# Patient Record
Sex: Male | Born: 1940 | Race: White | Hispanic: No | Marital: Married | Smoking: Former smoker
Health system: Southern US, Community
[De-identification: ages and names within clinical notes are randomized; demographics above are authoritative.]

## PROBLEM LIST (undated history)

## (undated) DIAGNOSIS — R7303 Prediabetes: Secondary | ICD-10-CM

## (undated) DIAGNOSIS — C801 Malignant (primary) neoplasm, unspecified: Secondary | ICD-10-CM

## (undated) DIAGNOSIS — K219 Gastro-esophageal reflux disease without esophagitis: Secondary | ICD-10-CM

## (undated) DIAGNOSIS — Z87442 Personal history of urinary calculi: Secondary | ICD-10-CM

## (undated) DIAGNOSIS — I209 Angina pectoris, unspecified: Secondary | ICD-10-CM

## (undated) DIAGNOSIS — G459 Transient cerebral ischemic attack, unspecified: Secondary | ICD-10-CM

## (undated) DIAGNOSIS — E7800 Pure hypercholesterolemia, unspecified: Secondary | ICD-10-CM

## (undated) DIAGNOSIS — E119 Type 2 diabetes mellitus without complications: Secondary | ICD-10-CM

## (undated) DIAGNOSIS — F419 Anxiety disorder, unspecified: Secondary | ICD-10-CM

## (undated) DIAGNOSIS — I499 Cardiac arrhythmia, unspecified: Secondary | ICD-10-CM

## (undated) DIAGNOSIS — I639 Cerebral infarction, unspecified: Secondary | ICD-10-CM

## (undated) DIAGNOSIS — N289 Disorder of kidney and ureter, unspecified: Secondary | ICD-10-CM

## (undated) DIAGNOSIS — I10 Essential (primary) hypertension: Secondary | ICD-10-CM

## (undated) HISTORY — PX: NEPHRECTOMY: SHX65

## (undated) HISTORY — PX: TONSILLECTOMY: SUR1361

---

## 2018-10-04 ENCOUNTER — Other Ambulatory Visit: Payer: Self-pay

## 2018-10-04 ENCOUNTER — Observation Stay (HOSPITAL_BASED_OUTPATIENT_CLINIC_OR_DEPARTMENT_OTHER)
Admission: EM | Admit: 2018-10-04 | Discharge: 2018-10-05 | Disposition: A | Discharge status: Home / Self Care | Payer: Medicare HMO | Attending: Family Medicine | Admitting: Family Medicine

## 2018-10-04 ENCOUNTER — Emergency Department (HOSPITAL_BASED_OUTPATIENT_CLINIC_OR_DEPARTMENT_OTHER): Payer: Medicare HMO

## 2018-10-04 ENCOUNTER — Observation Stay (HOSPITAL_COMMUNITY): Payer: Medicare HMO

## 2018-10-04 ENCOUNTER — Encounter (HOSPITAL_BASED_OUTPATIENT_CLINIC_OR_DEPARTMENT_OTHER): Payer: Self-pay | Admitting: *Deleted

## 2018-10-04 DIAGNOSIS — K219 Gastro-esophageal reflux disease without esophagitis: Secondary | ICD-10-CM | POA: Diagnosis not present

## 2018-10-04 DIAGNOSIS — I129 Hypertensive chronic kidney disease with stage 1 through stage 4 chronic kidney disease, or unspecified chronic kidney disease: Secondary | ICD-10-CM | POA: Insufficient documentation

## 2018-10-04 DIAGNOSIS — I10 Essential (primary) hypertension: Secondary | ICD-10-CM | POA: Diagnosis not present

## 2018-10-04 DIAGNOSIS — E871 Hypo-osmolality and hyponatremia: Secondary | ICD-10-CM | POA: Diagnosis present

## 2018-10-04 DIAGNOSIS — Z87891 Personal history of nicotine dependence: Secondary | ICD-10-CM | POA: Insufficient documentation

## 2018-10-04 DIAGNOSIS — Z7984 Long term (current) use of oral hypoglycemic drugs: Secondary | ICD-10-CM | POA: Diagnosis not present

## 2018-10-04 DIAGNOSIS — I639 Cerebral infarction, unspecified: Principal | ICD-10-CM | POA: Insufficient documentation

## 2018-10-04 DIAGNOSIS — E1129 Type 2 diabetes mellitus with other diabetic kidney complication: Secondary | ICD-10-CM | POA: Diagnosis present

## 2018-10-04 DIAGNOSIS — E785 Hyperlipidemia, unspecified: Secondary | ICD-10-CM | POA: Diagnosis not present

## 2018-10-04 DIAGNOSIS — N183 Chronic kidney disease, stage 3 unspecified: Secondary | ICD-10-CM | POA: Diagnosis present

## 2018-10-04 DIAGNOSIS — E1122 Type 2 diabetes mellitus with diabetic chronic kidney disease: Secondary | ICD-10-CM | POA: Diagnosis not present

## 2018-10-04 DIAGNOSIS — I447 Left bundle-branch block, unspecified: Secondary | ICD-10-CM | POA: Diagnosis not present

## 2018-10-04 DIAGNOSIS — G459 Transient cerebral ischemic attack, unspecified: Secondary | ICD-10-CM | POA: Diagnosis not present

## 2018-10-04 DIAGNOSIS — Z20828 Contact with and (suspected) exposure to other viral communicable diseases: Secondary | ICD-10-CM | POA: Diagnosis not present

## 2018-10-04 DIAGNOSIS — R202 Paresthesia of skin: Secondary | ICD-10-CM | POA: Diagnosis present

## 2018-10-04 HISTORY — DX: Transient cerebral ischemic attack, unspecified: G45.9

## 2018-10-04 HISTORY — DX: Anxiety disorder, unspecified: F41.9

## 2018-10-04 HISTORY — DX: Malignant (primary) neoplasm, unspecified: C80.1

## 2018-10-04 HISTORY — DX: Essential (primary) hypertension: I10

## 2018-10-04 HISTORY — DX: Type 2 diabetes mellitus without complications: E11.9

## 2018-10-04 HISTORY — DX: Disorder of kidney and ureter, unspecified: N28.9

## 2018-10-04 HISTORY — DX: Personal history of urinary calculi: Z87.442

## 2018-10-04 HISTORY — DX: Gastro-esophageal reflux disease without esophagitis: K21.9

## 2018-10-04 HISTORY — DX: Prediabetes: R73.03

## 2018-10-04 HISTORY — DX: Cardiac arrhythmia, unspecified: I49.9

## 2018-10-04 HISTORY — DX: Pure hypercholesterolemia, unspecified: E78.00

## 2018-10-04 HISTORY — DX: Cerebral infarction, unspecified: I63.9

## 2018-10-04 HISTORY — DX: Angina pectoris, unspecified: I20.9

## 2018-10-04 LAB — COMPREHENSIVE METABOLIC PANEL
ALT: 46 U/L — ABNORMAL HIGH (ref 0–44)
AST: 27 U/L (ref 15–41)
Albumin: 4.2 g/dL (ref 3.5–5.0)
Alkaline Phosphatase: 44 U/L (ref 38–126)
Anion gap: 14 (ref 5–15)
BUN: 14 mg/dL (ref 8–23)
CO2: 22 mmol/L (ref 22–32)
Calcium: 8.9 mg/dL (ref 8.9–10.3)
Chloride: 92 mmol/L — ABNORMAL LOW (ref 98–111)
Creatinine, Ser: 1.5 mg/dL — ABNORMAL HIGH (ref 0.61–1.24)
GFR calc Af Amer: 51 mL/min — ABNORMAL LOW (ref >60)
GFR calc non Af Amer: 44 mL/min — ABNORMAL LOW (ref >60)
Glucose, Bld: 105 mg/dL — ABNORMAL HIGH (ref 70–99)
Potassium: 3.6 mmol/L (ref 3.5–5.1)
Sodium: 128 mmol/L — ABNORMAL LOW (ref 135–145)
Total Bilirubin: 1.2 mg/dL (ref 0.3–1.2)
Total Protein: 6.8 g/dL (ref 6.5–8.1)

## 2018-10-04 LAB — DIFFERENTIAL
Abs Immature Granulocytes: 0.05 10*3/uL (ref 0.00–0.07)
Basophils Absolute: 0.1 10*3/uL (ref 0.0–0.1)
Basophils Relative: 1 %
Eosinophils Absolute: 0.2 10*3/uL (ref 0.0–0.5)
Eosinophils Relative: 2 %
Immature Granulocytes: 1 %
Lymphocytes Relative: 12 %
Lymphs Abs: 1.2 10*3/uL (ref 0.7–4.0)
Monocytes Absolute: 1.3 10*3/uL — ABNORMAL HIGH (ref 0.1–1.0)
Monocytes Relative: 12 %
Neutro Abs: 7.8 10*3/uL — ABNORMAL HIGH (ref 1.7–7.7)
Neutrophils Relative %: 72 %

## 2018-10-04 LAB — CBC
HCT: 46.3 % (ref 39.0–52.0)
Hemoglobin: 15.8 g/dL (ref 13.0–17.0)
MCH: 29.2 pg (ref 26.0–34.0)
MCHC: 34.1 g/dL (ref 30.0–36.0)
MCV: 85.6 fL (ref 80.0–100.0)
Platelets: 217 10*3/uL (ref 150–400)
RBC: 5.41 MIL/uL (ref 4.22–5.81)
RDW: 12.6 % (ref 11.5–15.5)
WBC: 10.6 10*3/uL — ABNORMAL HIGH (ref 4.0–10.5)
nRBC: 0 % (ref 0.0–0.2)

## 2018-10-04 LAB — APTT: aPTT: 27 seconds (ref 24–36)

## 2018-10-04 LAB — GLUCOSE, CAPILLARY: Glucose-Capillary: 106 mg/dL — ABNORMAL HIGH (ref 70–99)

## 2018-10-04 LAB — OSMOLALITY: Osmolality: 262 mOsm/kg — ABNORMAL LOW (ref 275–295)

## 2018-10-04 LAB — TROPONIN I (HIGH SENSITIVITY)
Troponin I (High Sensitivity): 8 ng/L (ref <18)
Troponin I (High Sensitivity): 9 ng/L (ref <18)

## 2018-10-04 LAB — SARS CORONAVIRUS 2 BY RT PCR (HOSPITAL ORDER, PERFORMED IN ~~LOC~~ HOSPITAL LAB): SARS Coronavirus 2: NEGATIVE

## 2018-10-04 LAB — PROTIME-INR
INR: 1 (ref 0.8–1.2)
Prothrombin Time: 13.2 seconds (ref 11.4–15.2)

## 2018-10-04 LAB — CBG MONITORING, ED: Glucose-Capillary: 106 mg/dL — ABNORMAL HIGH (ref 70–99)

## 2018-10-04 LAB — SODIUM: Sodium: 124 mmol/L — ABNORMAL LOW (ref 135–145)

## 2018-10-04 MED ORDER — HYDRALAZINE HCL 20 MG/ML IJ SOLN: 5.0000 mg | INTRAMUSCULAR | Status: DC | PRN

## 2018-10-04 MED ORDER — ASPIRIN 81 MG PO CHEW
324.0000 mg | CHEWABLE_TABLET | Freq: Once | ORAL | Status: AC
  Administered 2018-10-04: 324 mg via ORAL
  Filled 2018-10-04: qty 4

## 2018-10-04 MED ORDER — SODIUM CHLORIDE 0.9% FLUSH
3.0000 mL | Freq: Once | INTRAVENOUS | Status: DC
  Filled 2018-10-04: qty 3

## 2018-10-04 MED ORDER — INSULIN ASPART 100 UNIT/ML ~~LOC~~ SOLN: 0.0000 [IU] | Freq: Three times a day (TID) | SUBCUTANEOUS | Status: DC

## 2018-10-04 MED ORDER — METOPROLOL TARTRATE 25 MG PO TABS
25.0000 mg | ORAL_TABLET | Freq: Two times a day (BID) | ORAL | Status: DC
  Administered 2018-10-05: 25 mg via ORAL
  Filled 2018-10-04: qty 1

## 2018-10-04 MED ORDER — ATORVASTATIN CALCIUM 10 MG PO TABS: 10.0000 mg | ORAL_TABLET | Freq: Every day | ORAL | Status: DC

## 2018-10-04 MED ORDER — ATORVASTATIN CALCIUM 80 MG PO TABS
80.0000 mg | ORAL_TABLET | Freq: Every day | ORAL | Status: DC
  Administered 2018-10-05: 80 mg via ORAL
  Filled 2018-10-04: qty 1

## 2018-10-04 MED ORDER — FINASTERIDE 5 MG PO TABS
5.0000 mg | ORAL_TABLET | Freq: Every day | ORAL | Status: DC
  Filled 2018-10-04: qty 1

## 2018-10-04 MED ORDER — GADOBUTROL 1 MMOL/ML IV SOLN
10.0000 mL | Freq: Once | INTRAVENOUS | Status: AC | PRN
  Administered 2018-10-04: 10 mL via INTRAVENOUS

## 2018-10-04 MED ORDER — SODIUM CHLORIDE 0.9 % IV BOLUS
1000.0000 mL | Freq: Once | INTRAVENOUS | Status: AC
  Administered 2018-10-04: 1000 mL via INTRAVENOUS

## 2018-10-04 MED ORDER — ONDANSETRON HCL 4 MG/2ML IJ SOLN: 4.0000 mg | Freq: Three times a day (TID) | INTRAMUSCULAR | Status: DC | PRN

## 2018-10-04 MED ORDER — PANTOPRAZOLE SODIUM 40 MG PO TBEC
40.0000 mg | DELAYED_RELEASE_TABLET | Freq: Every day | ORAL | Status: DC
  Administered 2018-10-04: 40 mg via ORAL
  Filled 2018-10-04 (×2): qty 1

## 2018-10-04 MED ORDER — ENOXAPARIN SODIUM 40 MG/0.4ML ~~LOC~~ SOLN
40.0000 mg | SUBCUTANEOUS | Status: DC
  Administered 2018-10-04: 40 mg via SUBCUTANEOUS
  Filled 2018-10-04: qty 0.4

## 2018-10-04 MED ORDER — STROKE: EARLY STAGES OF RECOVERY BOOK
Freq: Once | Status: AC
  Administered 2018-10-04: 21:00:00
  Filled 2018-10-04: qty 1

## 2018-10-04 MED ORDER — ZOLPIDEM TARTRATE 5 MG PO TABS
5.0000 mg | ORAL_TABLET | Freq: Every evening | ORAL | Status: DC | PRN
  Administered 2018-10-04: 5 mg via ORAL
  Filled 2018-10-04: qty 1

## 2018-10-04 MED ORDER — TAMSULOSIN HCL 0.4 MG PO CAPS: 0.4000 mg | ORAL_CAPSULE | Freq: Every day | ORAL | Status: DC

## 2018-10-04 MED ORDER — ACETAMINOPHEN 325 MG PO TABS: 650.0000 mg | ORAL_TABLET | Freq: Four times a day (QID) | ORAL | Status: DC | PRN

## 2018-10-04 MED ORDER — SENNOSIDES-DOCUSATE SODIUM 8.6-50 MG PO TABS: 1.0000 | ORAL_TABLET | Freq: Every evening | ORAL | Status: DC | PRN

## 2018-10-04 MED ORDER — INSULIN ASPART 100 UNIT/ML ~~LOC~~ SOLN: 0.0000 [IU] | Freq: Every day | SUBCUTANEOUS | Status: DC

## 2018-10-04 MED ORDER — ASPIRIN EC 325 MG PO TBEC: 325.0000 mg | DELAYED_RELEASE_TABLET | Freq: Every day | ORAL | Status: DC

## 2018-10-04 NOTE — ED Notes (Signed)
Patient transported to CT 

## 2018-10-04 NOTE — Consult Note (Signed)
   [facility] TeleNeurology Consult Services   Date of Service:   10/04/2018 12:56:10  Impression:     .  Left Hemispheric Infarct  Comments/Sign-Out: Patient presented with right arm numbness. HCT showed no acute process. No tPA was offered due to non disabling symptoms. Would recommend ASA for secondary prevention.  Metrics: Last Known Well: 10/04/2018 09:30:00 [facility] Notification Time: 10/04/2018 12:55:15 Arrival Time: 10/04/2018 12:31:00 Stamp Time: 10/04/2018 12:56:10 Time First Login Attempt: 10/04/2018 13:02:59 Video Start Time: 10/04/2018 13:02:59  Symptoms: Right arm numbness NIHSS Start Assessment Time: 10/04/2018 13:03:00 Patient is not a candidate for tPA. Patient was not deemed candidate for tPA thrombolytics because of Resolved symptoms (no residual disabling symptoms). Video End Time: 10/04/2018 13:16:03  CT head showed no acute hemorrhage or acute core infarct.  Clinical Presentation is not Suggestive of Large Vessel Occlusive Disease  ED Physician notified of diagnostic impression and management plan on 10/04/2018 13:12:46  Sign Out:     .  Discussed with Emergency Department Provider    ------------------------------------------------------------------------------  History of Present Illness: Patient is a 78 year old Male.  Patient was brought by private transportation with symptoms of Right arm numbness  PMH of HTN, BPH, HLD presented with right arm numbness. LKN was around [place] witnessed by wife. He initially had difficulty walking but later was able to do so. However the numbness on the right arm persisted. Although at this time it is 70% back to normal.  Last seen normal was within 4.5 hours. There is no history of hemorrhagic complications or intracranial hemorrhage. There is no history of Recent Anticoagulants. There is no history of recent major surgery. There is no history of recent stroke.  Examination: 1A: Level of  Consciousness - Alert; keenly responsive + 0 1B: Ask Month and Age - Both Questions Right + 0 1C: Blink Eyes & Squeeze Hands - Performs Both Tasks + 0 2: Test Horizontal Extraocular Movements - Normal + 0 3: Test Visual Fields - No Visual Loss + 0 4: Test Facial Palsy (Use Grimace if Obtunded) - Normal symmetry + 0 5A: Test Left Arm Motor Drift - No Drift for 10 Seconds + 0 5B: Test Right Arm Motor Drift - No Drift for 10 Seconds + 0 6A: Test Left Leg Motor Drift - No Drift for 5 Seconds + 0 6B: Test Right Leg Motor Drift - No Drift for 5 Seconds + 0 7: Test Limb Ataxia (FNF/Heel-Shin) - No Ataxia + 0 8: Test Sensation - Mild-Moderate Loss: Less Sharp/More Dull + 1 9: Test Language/Aphasia - Normal; No aphasia + 0 10: Test Dysarthria - Normal + 0 11: Test Extinction/Inattention - No abnormality + 0  NIHSS Score: 1  Patient/Family was informed the Neurology Consult would happen via TeleHealth consult by way of interactive audio and video telecommunications and consented to receiving care in this manner.    Dr Hinda Lenis Chevelle Durr   [facility] [phone]  Case [id number]

## 2018-10-04 NOTE — ED Notes (Signed)
Pt returned from CT °

## 2018-10-04 NOTE — Progress Notes (Signed)
Pt arrived to unit. AOx4

## 2018-10-04 NOTE — ED Notes (Signed)
ED TO INPATIENT HANDOFF REPORT  ED Nurse Name and Phone #: Marisa Hua RN [phone]  S Name/Age/Gender Corey Lang 78 y.o. male Room/Bed: MH10/MH10  Code Status   Code Status: Not on file  Home/SNF/Other Hospital Patient oriented to: self, place, time and situation Is this baseline? Yes   Triage Complete: Triage complete  Chief Complaint RIGHT ARM NUMBNESS AND WEAK  Triage Note Right arm numbness while on his am walk 3 hours ago. He got weak and had to call his wife to drive and pick him up. He is alert and oriented. Numbness in his right knee and hip. He has a headache. He is alert and oriented.    Allergies No Known Allergies  Level of Care/Admitting Diagnosis ED Disposition    ED Disposition Condition Comment   Admit  Hospital Area: [facility] [[id number]]  Level of Care: Telemetry Medical [104]  I expect the patient will be discharged within 24 hours: No (not a candidate for 5C-Observation unit)  Covid Evaluation: Confirmed COVID Negative  Diagnosis: TIA (transient ischemic attack) [419379]  Admitting Physician: Guilford Shi [[id number]]  Attending Physician: Guilford Shi [[id number]]  PT Class (Do Not Modify): Observation [104]  PT Acc Code (Do Not Modify): Observation [10022]       B Medical/Surgery History Past Medical History:  Diagnosis Date  . Anxiety   . Cancer ([place])   . Diabetes mellitus without complication ([place])   . High cholesterol   . High cholesterol   . Hypertension   . Renal disorder   . TIA (transient ischemic attack)    Past Surgical History:  Procedure Laterality Date  . NEPHRECTOMY    . TONSILLECTOMY       A IV Location/Drains/Wounds Patient Lines/Drains/Airways Status   Active Line/Drains/Airways    Name:   Placement date:   Placement time:   Site:   Days:   Peripheral IV 10/04/18 Right Hand   10/04/18    1303    Hand   less than 1          Intake/Output Last 24 hours  Intake/Output Summary (Last  24 hours) at 10/04/2018 1558 Last data filed at 10/04/2018 1551 Gross per 24 hour  Intake 990.52 ml  Output -  Net 990.52 ml    Labs/Imaging Results for orders placed or performed during the hospital encounter of 10/04/18 (from the past 48 hour(s))  CBG monitoring, ED     Status: Abnormal   Collection Time: 10/04/18 12:52 PM  Result Value Ref Range   Glucose-Capillary 106 (H) 70 - 99 mg/dL  CBC     Status: Abnormal   Collection Time: 10/04/18  1:00 PM  Result Value Ref Range   WBC 10.6 (H) 4.0 - 10.5 K/uL   RBC 5.41 4.22 - 5.81 MIL/uL   Hemoglobin 15.8 13.0 - 17.0 g/dL   HCT 46.3 39.0 - 52.0 %   MCV 85.6 80.0 - 100.0 fL   MCH 29.2 26.0 - 34.0 pg   MCHC 34.1 30.0 - 36.0 g/dL   RDW 12.6 11.5 - 15.5 %   Platelets 217 150 - 400 K/uL   nRBC 0.0 0.0 - 0.2 %    Comment: Performed at [facility], [address] [place], [place] [address]  Differential     Status: Abnormal   Collection Time: 10/04/18  1:00 PM  Result Value Ref Range   Neutrophils Relative % 72 %   Neutro Abs 7.8 (H) 1.7 - 7.7 K/uL  Lymphocytes Relative 12 %   Lymphs Abs 1.2 0.7 - 4.0 K/uL   Monocytes Relative 12 %   Monocytes Absolute 1.3 (H) 0.1 - 1.0 K/uL   Eosinophils Relative 2 %   Eosinophils Absolute 0.2 0.0 - 0.5 K/uL   Basophils Relative 1 %   Basophils Absolute 0.1 0.0 - 0.1 K/uL   Immature Granulocytes 1 %   Abs Immature Granulocytes 0.05 0.00 - 0.07 K/uL    Comment: Performed at [facility], [address] [place], [place] [address]  Comprehensive metabolic panel     Status: Abnormal   Collection Time: 10/04/18  1:00 PM  Result Value Ref Range   Sodium 128 (L) 135 - 145 mmol/L   Potassium 3.6 3.5 - 5.1 mmol/L   Chloride 92 (L) 98 - 111 mmol/L   CO2 22 22 - 32 mmol/L   Glucose, Bld 105 (H) 70 - 99 mg/dL   BUN 14 8 - 23 mg/dL   Creatinine, Ser 1.50 (H) 0.61 - 1.24 mg/dL   Calcium 8.9 8.9 - 10.3 mg/dL   Total Protein 6.8 6.5 - 8.1 g/dL   Albumin 4.2 3.5 - 5.0 g/dL   AST  27 15 - 41 U/L   ALT 46 (H) 0 - 44 U/L   Alkaline Phosphatase 44 38 - 126 U/L   Total Bilirubin 1.2 0.3 - 1.2 mg/dL   GFR calc non Af Amer 44 (L) >60 mL/min   GFR calc Af Amer 51 (L) >60 mL/min   Anion gap 14 5 - 15    Comment: Performed at [facility], [address] [place], [place] [address]  SARS Coronavirus 2 (Performed in [place] hospital lab)     Status: None   Collection Time: 10/04/18  1:01 PM   Specimen: Nasopharyngeal Swab  Result Value Ref Range   SARS Coronavirus 2 NEGATIVE NEGATIVE    Comment: (NOTE) If result is NEGATIVE SARS-CoV-2 target nucleic acids are NOT DETECTED. The SARS-CoV-2 RNA is generally detectable in upper and lower  respiratory specimens during the acute phase of infection. The lowest  concentration of SARS-CoV-2 viral copies this assay can detect is 250  copies / mL. A negative result does not preclude SARS-CoV-2 infection  and should not be used as the sole basis for treatment or other  patient management decisions.  A negative result may occur with  improper specimen collection / handling, submission of specimen other  than nasopharyngeal swab, presence of viral mutation(s) within the  areas targeted by this assay, and inadequate number of viral copies  (<250 copies / mL). A negative result must be combined with clinical  observations, patient history, and epidemiological information. If result is POSITIVE SARS-CoV-2 target nucleic acids are DETECTED. The SARS-CoV-2 RNA is generally detectable in upper and lower  respiratory specimens dur ing the acute phase of infection.  Positive  results are indicative of active infection with SARS-CoV-2.  Clinical  correlation with patient history and other diagnostic information is  necessary to determine patient infection status.  Positive results do  not rule out bacterial infection or co-infection with other viruses. If result is PRESUMPTIVE POSTIVE SARS-CoV-2 nucleic acids MAY BE  PRESENT.   A presumptive positive result was obtained on the submitted specimen  and confirmed on repeat testing.  While 2019 novel coronavirus  (SARS-CoV-2) nucleic acids may be present in the submitted sample  additional confirmatory testing may be necessary for epidemiological  and / or clinical  management purposes  to differentiate between  SARS-CoV-2 and other Sarbecovirus currently known to infect humans.  If clinically indicated additional testing with an alternate test  methodology [phone]) is advised. The SARS-CoV-2 RNA is generally  detectable in upper and lower respiratory sp ecimens during the acute  phase of infection. The expected result is Negative. Fact Sheet for Patients:  StrictlyIdeas.no Fact Sheet for Healthcare Providers: BankingDealers.co.za This test is not yet approved or cleared by the [place] FDA and has been authorized for detection and/or diagnosis of SARS-CoV-2 by FDA under an Emergency Use Authorization (EUA).  This EUA will remain in effect (meaning this test can be used) for the duration of the COVID-19 declaration under Section 564(b)(1) of the Act, 21 U.S.C. section 360bbb-3(b)(1), unless the authorization is terminated or revoked sooner. Performed at [facility], [address] [place], [place] [address]   Protime-INR     Status: None   Collection Time: 10/04/18  2:37 PM  Result Value Ref Range   Prothrombin Time 13.2 11.4 - 15.2 seconds   INR 1.0 0.8 - 1.2    Comment: (NOTE) INR goal varies based on device and disease states. Performed at [facility], [address] [place], [place] [address]   APTT     Status: None   Collection Time: 10/04/18  2:37 PM  Result Value Ref Range   aPTT 27 24 - 36 seconds    Comment: Performed at [facility], [address] [place], [place] [address]   Ct Head Code Stroke Wo Contrast  Result Date: 10/04/2018 CLINICAL  DATA:  Code stroke. Numbness and tingling of the right arm with right-sided weakness beginning today. EXAM: CT HEAD WITHOUT CONTRAST TECHNIQUE: Contiguous axial images were obtained from the base of the skull through the vertex without intravenous contrast. COMPARISON:  None. FINDINGS: Brain: Generalized age related atrophy. No focal insult seen affecting the brainstem or cerebellum. Cerebral hemispheres show chronic small-vessel ischemic changes of the white matter. Focal 4 mm parenchymal calcification at the inferior right frontal lobe. No sign of mass effect or edema. No hemorrhage, hydrocephalus or extra-axial collection. Vascular: There is atherosclerotic calcification of the major vessels at the base of the brain. Skull: Negative Sinuses/Orbits: Clear/normal Other: None ASPECTS (Pace Stroke Program Early CT Score) - Ganglionic level infarction (caudate, lentiform nuclei, internal capsule, insula, M1-M3 cortex): 7 - Supraganglionic infarction (M4-M6 cortex): 3 Total score (0-10 with 10 being normal): 10 IMPRESSION: 1. No acute finding by CT. Age related atrophy and chronic small-vessel change of the hemispheric white matter. Benign appearing 4 mm parenchymal calcification at the inferior right frontal lobe. 2. ASPECTS is 10. 3. These results were called by telephone at the time of interpretation on 10/04/2018 at 1:05 pm to Dr. Gerlene Fee , who verbally acknowledged these results. Electronically Signed   By: Nelson Chimes M.D.   On: 10/04/2018 13:08    Pending Labs Unresulted Labs (From admission, onward)   None      Vitals/Pain Today's Vitals   10/04/18 1404 10/04/18 1430 10/04/18 1530 10/04/18 1535  BP: (!) 155/77 140/85 140/81   Pulse: 63 (!) 59 (!) 56   Resp: 20 (!) 23 19   Temp:      TempSrc:      SpO2: 93% 96% 100%   Weight:      Height:      PainSc:    0-No pain    Isolation Precautions No active  isolations  Medications Medications  sodium chloride flush (NS) 0.9 % injection  3 mL (3 mLs Intravenous Not Given 10/04/18 1258)  sodium chloride 0.9 % bolus 1,000 mL ( Intravenous Stopped 10/04/18 1549)  aspirin chewable tablet 324 mg (324 mg Oral Given 10/04/18 1448)    Mobility walks Low fall risk   Focused Assessments Neuro Assessment Handoff:  Swallow screen pass? Yes    NIH Stroke Scale ( + Modified Stroke Scale Criteria)  Level of Consciousness (1a.)   : Alert, keenly responsive LOC Questions (1b. )   +: Answers both questions correctly LOC Commands (1c. )   + : Performs both tasks correctly Best Gaze (2. )  +: Normal Visual (3. )  +: No visual loss Facial Palsy (4. )    : Normal symmetrical movements Motor Arm, Left (5a. )   +: No drift Motor Arm, Right (5b. )   +: No drift Motor Leg, Left (6a. )   +: No drift Motor Leg, Right (6b. )   +: No drift Limb Ataxia (7. ): Absent Sensory (8. )   +: Mild-to-moderate sensory loss, patient feels pinprick is less sharp or is dull on the affected side, or there is a loss of superficial pain with pinprick, but patient is aware of being touched Best Language (9. )   +: No aphasia Dysarthria (10. ): Normal Extinction/Inattention (11.)   +: No Abnormality Modified SS Total  +: 1 Complete NIHSS TOTAL: 1     Neuro Assessment:   Neuro Checks:      Last Documented NIHSS Modified Score: 1 (10/04/18 1449) Has TPA been given? No If patient is a Neuro Trauma and patient is going to OR before floor call report to [place] nurse: [phone] or [phone]     R Recommendations: See Admitting Provider Note  Report given to:   Additional Notes: Pt has poor kidney function. No contrast done with CTA

## 2018-10-04 NOTE — ED Triage Notes (Signed)
Right arm numbness while on his am walk 3 hours ago. He got weak and had to call his wife to drive and pick him up. He is alert and oriented. Numbness in his right knee and hip. He has a headache. He is alert and oriented.

## 2018-10-04 NOTE — H&P (Addendum)
History and Physical    Corey Lang GYB:[id number] DOB: [birth year] DOA: 10/04/2018  Referring MD/NP/PA:   PCP: Reita Cliche, MD   Patient coming from:  The patient is coming from home.  At baseline, pt is independent for most of ADL.        Chief Complaint: Right-sided numbness and weakness  HPI: Corey Lang is a 78 y.o. 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  Patient states that his symptoms started at about 9:30 AM when he was walking. He states that he has numbness and weakness in both right arm and upper leg.  He also had possible slurred speech.  Patient states that his symptoms improved, but still has some numbness and weakness in both right arm and the leg.  He also has headache.  No vision loss or hearing loss.  Patient denies any chest pain, shortness breath, cough, fever or chills.  No nausea vomiting, diarrhea, abdominal pain, symptoms of UTI.  ED Course: pt was found to have WBC 10.6, INR 1.0, PTT 27, negative COVID-19 test, sodium 128, creatinine close to baseline, temperature normal, blood pressure 170/98, heart rate 58, RR 20s, oxygen saturation 94% on room air.  CT head is negative for acute intracranial abnormalities.  Telemetry neurology, Dr. Drema Halon was consulted by EDP, who did not think patient need TPA.  Review of Systems:   General: no fevers, chills, no body weight gain, has fatigue HEENT: no blurry vision, hearing changes or sore throat Respiratory: no dyspnea, coughing, wheezing CV: no chest pain, no palpitations GI: no nausea, vomiting, abdominal pain, diarrhea, constipation GU: no dysuria, burning on urination, increased urinary frequency, hematuria  Ext: no leg edema Neuro: No vision change or hearing loss. Has weakness and numbness in breath arm and leg.  Had  possible slurred speech. Skin: no rash, no skin tear. MSK: No muscle spasm, no deformity, no limitation of range of movement in spin Heme: No easy bruising.  Travel history: No recent long distant travel.  Allergy: No Known Allergies  Past Medical History:  Diagnosis Date  . Anxiety   . Cancer ([place])   . Diabetes mellitus without complication ([place])   . High cholesterol   . High cholesterol   . Hypertension   . Renal disorder   . TIA (transient ischemic attack)     Past Surgical History:  Procedure Laterality Date  . NEPHRECTOMY    . TONSILLECTOMY      Social History:  reports that he has quit smoking. He has never used smokeless tobacco. He reports that he does not drink alcohol or use drugs.  Family History: No family history on file.   Prior to Admission medications   Medication Sig Start Date End Date Taking? Authorizing Provider  atorvastatin (LIPITOR) 10 MG tablet Take 10 mg by mouth daily.   Yes [provider]  finasteride (PROSCAR) 5 MG tablet Take 5 mg by mouth daily.   Yes [provider]  hydrochlorothiazide (HYDRODIURIL) 25 MG tablet Take 25 mg by mouth daily.   Yes [provider]  metFORMIN (GLUCOPHAGE) 500 MG tablet Take by mouth 2 (two) times daily with a meal.   Yes [provider]  metoprolol tartrate (LOPRESSOR) 25 MG tablet Take 25 mg by mouth 2 (two) times daily.   Yes [provider]  montelukast (SINGULAIR) 10 MG tablet Take 10 mg  by mouth at bedtime.   Yes [provider]  RABEprazole (ACIPHEX) 20 MG tablet Take 20 mg by mouth daily.   Yes [provider]  tamsulosin (FLOMAX) 0.4 MG CAPS capsule Take 0.4 mg by mouth.   Yes [provider]  valsartan (DIOVAN) 320 MG tablet Take 320 mg by mouth daily.   Yes [provider]    Physical Exam: Vitals:   10/04/18 1630 10/04/18 1730 10/04/18 1851 10/04/18 1944  BP: (!) 132/94 (!) 146/77 (!) 170/98 (!) 179/91  Pulse: 61 (!)  57 (!) 58 62  Resp: (!) 21 20    Temp:   97.6 F (36.4 C) 97.7 F (36.5 C)  TempSrc:   Oral Oral  SpO2: 99% 98% 94% 96%  Weight:      Height:       General: Not in acute distress HEENT:       Eyes: PERRL, EOMI, no scleral icterus.       ENT: No discharge from the ears and nose, no pharynx injection, no tonsillar enlargement.        Neck: No JVD, no bruit, no mass felt. Heme: No neck lymph node enlargement. Cardiac: S1/S2, RRR, No murmurs, No gallops or rubs. Respiratory: No rales, wheezing, rhonchi or rubs. GI: Soft, nondistended, nontender, no rebound pain, no organomegaly, BS present. GU: No hematuria Ext: No pitting leg edema bilaterally. 2+DP/PT pulse bilaterally. Musculoskeletal: No joint deformities, No joint redness or warmth, no limitation of ROM in spin. Skin: No rashes.  Neuro: Alert, oriented X3, cranial nerves II-XII grossly intact, moves all extremities normally. Muscle strength 5/5 in all extremities, has decreased sensation to light touch in right arm. Brachial reflex 2+ bilaterally. Negative Babinski's sign. Psych: Patient is not psychotic, no suicidal or hemocidal ideation.  Labs on Admission: I have personally reviewed following labs and imaging studies  CBC: Recent Labs  Lab 10/04/18 1300  WBC 10.6*  NEUTROABS 7.8*  HGB 15.8  HCT 46.3  MCV 85.6  PLT 161   Basic Metabolic Panel: Recent Labs  Lab 10/04/18 1300  NA 128*  K 3.6  CL 92*  CO2 22  GLUCOSE 105*  BUN 14  CREATININE 1.50*  CALCIUM 8.9   GFR: Estimated Creatinine Clearance: 49.2 mL/min (A) (by C-G formula based on SCr of 1.5 mg/dL (H)). Liver Function Tests: Recent Labs  Lab 10/04/18 1300  AST 27  ALT 46*  ALKPHOS 44  BILITOT 1.2  PROT 6.8  ALBUMIN 4.2   No results for input(s): LIPASE, AMYLASE in the last 168 hours. No results for input(s): AMMONIA in the last 168 hours. Coagulation Profile: Recent Labs  Lab 10/04/18 1437  INR 1.0   Cardiac Enzymes: No results for  input(s): CKTOTAL, CKMB, CKMBINDEX, TROPONINI in the last 168 hours. BNP (last 3 results) No results for input(s): PROBNP in the last 8760 hours. HbA1C: No results for input(s): HGBA1C in the last 72 hours. CBG: Recent Labs  Lab 10/04/18 1252  GLUCAP 106*   Lipid Profile: No results for input(s): CHOL, HDL, LDLCALC, TRIG, CHOLHDL, LDLDIRECT in the last 72 hours. Thyroid Function Tests: No results for input(s): TSH, T4TOTAL, FREET4, T3FREE, THYROIDAB in the last 72 hours. Anemia Panel: No results for input(s): VITAMINB12, FOLATE, FERRITIN, TIBC, IRON, RETICCTPCT in the last 72 hours. Urine analysis: No results found for: COLORURINE, APPEARANCEUR, LABSPEC, PHURINE, GLUCOSEU, HGBUR, BILIRUBINUR, KETONESUR, PROTEINUR, UROBILINOGEN, NITRITE, LEUKOCYTESUR Sepsis Labs: @LABRCNTIP (procalcitonin:4,lacticidven:4) ) Recent Results (from the past 240 hour(s))  SARS Coronavirus 2 (Performed  in [place] lab)     Status: None   Collection Time: 10/04/18  1:01 PM   Specimen: Nasopharyngeal Swab  Result Value Ref Range Status   SARS Coronavirus 2 NEGATIVE NEGATIVE Final    Comment: (NOTE) If result is NEGATIVE SARS-CoV-2 target nucleic acids are NOT DETECTED. The SARS-CoV-2 RNA is generally detectable in upper and lower  respiratory specimens during the acute phase of infection. The lowest  concentration of SARS-CoV-2 viral copies this assay can detect is 250  copies / mL. A negative result does not preclude SARS-CoV-2 infection  and should not be used as the sole basis for treatment or other  patient management decisions.  A negative result may occur with  improper specimen collection / handling, submission of specimen other  than nasopharyngeal swab, presence of viral mutation(s) within the  areas targeted by this assay, and inadequate number of viral copies  (<250 copies / mL). A negative result must be combined with clinical  observations, patient history, and epidemiological  information. If result is POSITIVE SARS-CoV-2 target nucleic acids are DETECTED. The SARS-CoV-2 RNA is generally detectable in upper and lower  respiratory specimens dur ing the acute phase of infection.  Positive  results are indicative of active infection with SARS-CoV-2.  Clinical  correlation with patient history and other diagnostic information is  necessary to determine patient infection status.  Positive results do  not rule out bacterial infection or co-infection with other viruses. If result is PRESUMPTIVE POSTIVE SARS-CoV-2 nucleic acids MAY BE PRESENT.   A presumptive positive result was obtained on the submitted specimen  and confirmed on repeat testing.  While 2019 novel coronavirus  (SARS-CoV-2) nucleic acids may be present in the submitted sample  additional confirmatory testing may be necessary for epidemiological  and / or clinical management purposes  to differentiate between  SARS-CoV-2 and other Sarbecovirus currently known to infect humans.  If clinically indicated additional testing with an alternate test  methodology [phone]) is advised. The SARS-CoV-2 RNA is generally  detectable in upper and lower respiratory sp ecimens during the acute  phase of infection. The expected result is Negative. Fact Sheet for Patients:  StrictlyIdeas.no Fact Sheet for Healthcare Providers: BankingDealers.co.za This test is not yet approved or cleared by the [place] FDA and has been authorized for detection and/or diagnosis of SARS-CoV-2 by FDA under an Emergency Use Authorization (EUA).  This EUA will remain in effect (meaning this test can be used) for the duration of the COVID-19 declaration under Section 564(b)(1) of the Act, 21 U.S.C. section 360bbb-3(b)(1), unless the authorization is terminated or revoked sooner. Performed at [facility], [address] [place], [place] [address]      Radiological Exams  on Admission: Ct Head Code Stroke Wo Contrast  Result Date: 10/04/2018 CLINICAL DATA:  Code stroke. Numbness and tingling of the right arm with right-sided weakness beginning today. EXAM: CT HEAD WITHOUT CONTRAST TECHNIQUE: Contiguous axial images were obtained from the base of the skull through the vertex without intravenous contrast. COMPARISON:  None. FINDINGS: Brain: Generalized age related atrophy. No focal insult seen affecting the brainstem or cerebellum. Cerebral hemispheres show chronic small-vessel ischemic changes of the white matter. Focal 4 mm parenchymal calcification at the inferior right frontal lobe. No sign of mass effect or edema. No hemorrhage, hydrocephalus or extra-axial collection. Vascular: There is atherosclerotic calcification of the major vessels at the base of the brain. Skull: Negative Sinuses/Orbits: Clear/normal Other: None ASPECTS ([place] Stroke  Program Early CT Score) - Ganglionic level infarction (caudate, lentiform nuclei, internal capsule, insula, M1-M3 cortex): 7 - Supraganglionic infarction (M4-M6 cortex): 3 Total score (0-10 with 10 being normal): 10 IMPRESSION: 1. No acute finding by CT. Age related atrophy and chronic small-vessel change of the hemispheric white matter. Benign appearing 4 mm parenchymal calcification at the inferior right frontal lobe. 2. ASPECTS is 10. 3. These results were called by telephone at the time of interpretation on 10/04/2018 at 1:05 pm to Dr. Gerlene Fee , who verbally acknowledged these results. Electronically Signed   By: Nelson Chimes M.D.   On: 10/04/2018 13:08     EKG: Independently reviewed.  Sinus rhythm, QTC 449, left bundle blockage, poor R wave progression, LAE.  No old EKG to compare.  Assessment/Plan Principal Problem:   TIA (transient ischemic attack) Active Problems:   Type II diabetes mellitus with renal manifestations (HCC)   Hypertension   HLD (hyperlipidemia)   CKD (chronic kidney disease), stage III (HCC)    Hyponatremia   LBBB (left bundle branch block)   GERD (gastroesophageal reflux disease)   Possible TIA vs. Stroke: Patient has been having right-sided weakness and a possible slurred speech.  Symptoms has improved, but still has some numbness and weakness in the right arm and leg, symptoms is concerning for possible TIA versus stroke.  CT head is negative for acute intracranial abnormalities.  Telemetry neurology, Dr. Samara Deist was consulted by EDP, who did not think patient need TPA.  Will do stroke/TIA work-up now. Dr. Leonel Ramsay for neurology was consulted.  - will place on tele bed for obs - will follow up Neurology's Recs.  - Obtain MRI/MRA  - Check carotid dopplers  - ASA - will increase Lipitor dose from 10 to 80 mg daily - fasting lipid panel and HbA1c  - 2D transthoracic echocardiography  - swallowing screen. If fails, will get SLP - Check UDS  - PT/OT consult  Type II diabetes mellitus with renal manifestations ([place]): Last A1c not on record. Patient is taking metformin at home.  Blood sugar 105. -SSI  Hypertension: -hold HCTZ and Diovan due to hyponatremia -Continue metoprolol -IV hydralazine as needed for SBP>185  HLD (hyperlipidemia): -lipitor  CKD (chronic kidney disease), stage III ([place]): Close to baseline.  Patient had a creatinine 1.41 on 03/11/2011.  His creatinine is 1.5, BUN 14. -Patient received 1 L normal saline bolus in the ED - Follow-up by BMP  Hyponatremia: Sodium 128.  Mental status normal.  Possibly due to HCTZ and diovan use -hold HCTZ and Diovan -- Will check urine sodium, urine osmolality, serum osmolality. - check TSH - IVF: 1L NS in ED - will check Sodium level now, is still low  Addendum:  Repeated Na 124. -will start fluid restriction -Start normal saline at 100 cc/h -BMP every 6 hours   LBBB (left bundle branch block): No old EKG for comparison.  Patient does not have any chest pain or shortness breath.  Very low suspicions for heart  attack. - Trend Trop - Repeat EKG in the am  - aspirin, lipitor  - Risk factor stratification: will check FLP and A1C  - check UDS  GERD (gastroesophageal reflux disease): -protonix     DVT ppx: SQ Lovenox Code Status: Full code Family Communication: None at bed side.   Disposition Plan:  Anticipate discharge back to previous home environment Consults called: Neurology, Dr. Leonel Ramsay Admission status: Obs / tele      Date of Service 10/04/2018  Ivor Costa [facility]   If 7PM-7AM, please contact night-coverage [email or web address] Password [facility] 10/04/2018, 7:53 PM

## 2018-10-04 NOTE — ED Notes (Signed)
Pt states that he has only one kidney and typically does not get IV dye.  Spoke to Dr. Sedonia Small and related information.  He related to cancel CT with contrast due to kidney function.  Called CT and notified [place].

## 2018-10-04 NOTE — ED Notes (Signed)
ED Provider at bedside. 

## 2018-10-04 NOTE — ED Provider Notes (Signed)
[facility] Emergency Department Provider Note MRN:  [id number]  Arrival date & time: 10/04/18     Chief Complaint   Numbness   History of Present Illness   Corey Lang is a 78 y.o. year-old male with a history of diabetes, hypertension presenting to the ED with chief complaint of numbness.  At 9:30 AM this morning, patient was walking when he experienced sudden onset complete weakness of his right side, inability to walk.  The weakness slowly subsided and regained normal function.  He continued to have numbness, very mild in the right arm, more prominent in the right leg.  Denies headache, no vomiting, no other symptoms.  Review of Systems  A complete 10 system review of systems was obtained and all systems are negative except as noted in the HPI and PMH.   Patient's Health History    Past Medical History:  Diagnosis Date  . Diabetes mellitus without complication ([place])   . High cholesterol   . Hypertension     Past Surgical History:  Procedure Laterality Date  . TONSILLECTOMY      No family history on file.  Social History   Socioeconomic History  . Marital status: Married    Spouse name: Not on file  . Number of children: Not on file  . Years of education: Not on file  . Highest education level: Not on file  Occupational History  . Not on file  Social Needs  . Financial resource strain: Not on file  . Food insecurity    Worry: Not on file    Inability: Not on file  . Transportation needs    Medical: Not on file    Non-medical: Not on file  Tobacco Use  . Smoking status: Former Research scientist (life sciences)  . Smokeless tobacco: Never Used  Substance and Sexual Activity  . Alcohol use: Never    Frequency: Never  . Drug use: Never  . Sexual activity: Not on file  Lifestyle  . Physical activity    Days per week: Not on file    Minutes per session: Not on file  . Stress: Not on file  Relationships  . Social Herbalist on phone: Not on  file    Gets together: Not on file    Attends religious service: Not on file    Active member of club or organization: Not on file    Attends meetings of clubs or organizations: Not on file    Relationship status: Not on file  . Intimate partner violence    Fear of current or ex partner: Not on file    Emotionally abused: Not on file    Physically abused: Not on file    Forced sexual activity: Not on file  Other Topics Concern  . Not on file  Social History Narrative  . Not on file     Physical Exam  Vital Signs and Nursing Notes reviewed Vitals:   10/04/18 1331 10/04/18 1404  BP: (!) 143/79 (!) 155/77  Pulse: 63 63  Resp: (!) 22 20  Temp:    SpO2: 94% 93%    CONSTITUTIONAL: Well-appearing, NAD NEURO:  Alert and oriented x 3, decreased sensation to the right leg, otherwise normal sensation, normal strength, no facial droop, no aphasia, no dysarthria, no neglect, no visual field cuts EYES:  eyes equal and reactive ENT/NECK:  no LAD, no JVD CARDIO: Regular rate, well-perfused, normal S1 and S2 PULM:  CTAB no wheezing or rhonchi  GI/GU:  normal bowel sounds, non-distended, non-tender MSK/SPINE:  No gross deformities, no edema SKIN:  no rash, atraumatic PSYCH:  Appropriate speech and behavior  Diagnostic and Interventional Summary    EKG Interpretation  Date/Time:  Thursday October 04 2018 12:58:57 EDT Ventricular Rate:  67 PR Interval:    QRS Duration: 141 QT Interval:  425 QTC Calculation: 449 R Axis:   56 Text Interpretation:  Sinus rhythm Left bundle branch block Confirmed by Gerlene Fee [phone]) on 10/04/2018 2:40:43 PM      Labs Reviewed  CBC - Abnormal; Notable for the following components:      Result Value   WBC 10.6 (*)    All other components within normal limits  DIFFERENTIAL - Abnormal; Notable for the following components:   Neutro Abs 7.8 (*)    Monocytes Absolute 1.3 (*)    All other components within normal limits  COMPREHENSIVE METABOLIC PANEL -  Abnormal; Notable for the following components:   Sodium 128 (*)    Chloride 92 (*)    Glucose, Bld 105 (*)    Creatinine, Ser 1.50 (*)    ALT 46 (*)    GFR calc non Af Amer 44 (*)    GFR calc Af Amer 51 (*)    All other components within normal limits  CBG MONITORING, ED - Abnormal; Notable for the following components:   Glucose-Capillary 106 (*)    All other components within normal limits  SARS CORONAVIRUS 2 (HOSPITAL ORDER, [place] [facility])  PROTIME-INR  APTT    CT HEAD CODE STROKE WO CONTRAST  Final Result      Medications  sodium chloride flush (NS) 0.9 % injection 3 mL (3 mLs Intravenous Not Given 10/04/18 1258)  sodium chloride 0.9 % bolus 1,000 mL (has no administration in time range)  aspirin chewable tablet 324 mg (has no administration in time range)     Procedures Critical Care Critical Care Documentation Critical care time provided by me (excluding procedures): 35 minutes  Condition necessitating critical care: Concern for acute ischemic stroke  Components of critical care management: reviewing of prior records, laboratory and imaging interpretation, frequent re-examination and reassessment of vital signs, initiation of code stroke protocol, discussion with consulting services.    ED Course and Medical Decision Making  I have reviewed the triage vital signs and the nursing notes.  Pertinent labs & imaging results that were available during my care of the patient were reviewed by me and considered in my medical decision making (see below for details).  Neurological deficit last known normal about 3 hours prior to arrival, code stroke alerted, CT head without contrast unremarkable, CTA imaging is still pending.  Patient was evaluated by telemetry neurologist, NIH stroke scale of 1, deficits not debilitating enough to warrant TPA.  Patient agrees with this plan to avoid TPA and pursue other medical management.  Will admit to hospital service  for further care.  Patient's creatinine is 1.5, he is hesitant to receive IV contrast.  Given that he is not a TPA candidate and his NIH stroke scale is low, we will defer CTA imaging and await MRI imaging while he is admitted.  Accepted for admission by hospitalist service.  Barth Kirks. Sedonia Small, [place] [email or web address]  Final Clinical Impressions(s) / ED Diagnoses     ICD-10-CM   1. Acute ischemic stroke [facility])  I63.9     ED Discharge Orders    None  Maudie Flakes, MD 10/04/18 [phone]

## 2018-10-04 NOTE — Consult Note (Signed)
Neurology Consultation Reason for Consult: Right-sided weakness and numbness Referring Physician: Mora Bellman  CC: Right-sided weakness and numbness  History is obtained from: Patient  HPI: Corey Lang is a 78 y.o. male with a history of hypertension, diabetes, hyperlipidemia who presents with transient right-sided weakness that started abruptly around 9:30 AM.  He states that his right side just gave out on him, and he was severely weak.  The severe symptoms lasted only for a few minutes and then rapidly improved.  By the time of arrival to [facility], his symptoms were no longer disabling and therefore TPA was not indicated.  He states that since his rapid improvement, he has had persistent mild right arm numbness from the shoulder to the thigh, as well as some milder right facial numbness.   LKW: 9:30 AM tpa given?: no, mild symptoms    ROS: A 14 point ROS was performed and is negative except as noted in the HPI.   Past Medical History:  Diagnosis Date  . Anxiety   . Cancer ([place])   . Diabetes mellitus without complication ([place])   . High cholesterol   . High cholesterol   . Hypertension   . Renal disorder   . TIA (transient ischemic attack)      No family history on file.   Social History:  reports that he has quit smoking. He has never used smokeless tobacco. He reports that he does not drink alcohol or use drugs.   Exam: Current vital signs: BP (!) 171/87 (BP Location: Right Arm)   Pulse 62   Temp 98 F (36.7 C) (Oral)   Resp 20   Ht 5\' 11"  (1.803 m)   Wt 104.9 kg   SpO2 96%   BMI 32.26 kg/m  Vital signs in last 24 hours: Temp:  [97.6 F (36.4 C)-98.2 F (36.8 C)] 98 F (36.7 C) (07/23 2320) Pulse Rate:  [57-69] 62 (07/23 2320) Resp:  [16-26] 20 (07/23 2320) BP: (132-179)/(77-98) 171/87 (07/23 2320) SpO2:  [93 %-99 %] 96 % (07/23 2320) Weight:  [104.9 kg] 104.9 kg (07/23 1239)   Physical Exam  Constitutional: Appears well-developed and well-nourished.   Psych: Affect appropriate to situation Eyes: No scleral injection HENT: No OP obstrucion Head: Normocephalic.  Cardiovascular: Normal rate and regular rhythm.  Respiratory: Effort normal, non-labored breathing GI: Soft.  No distension. There is no tenderness.  Skin: WDI  Neuro: Mental Status: Patient is awake, alert, oriented to person, place, month, year, and situation. Patient is able to give a clear and coherent history. No signs of aphasia or neglect Cranial Nerves: II: Visual Fields are full. Pupils are equal, round, and reactive to light.   III,IV, VI: EOMI without ptosis or diploplia.  V: Facial sensation is symmetric to temperature VII: Facial movement is symmetric.  VIII: hearing is intact to voice X: Uvula elevates symmetrically XI: Shoulder shrug is symmetric. XII: tongue is midline without atrophy or fasciculations.  Motor: Tone is normal. Bulk is normal. 5/5 strength was present in all four extremities.  Sensory: Sensation is symmetric to light touch, but mildly diminished to temperature in the right arm and leg. Deep Tendon Reflexes: 2+ and symmetric in the biceps and patellae.  Cerebellar: FNF and HKS are intact bilaterally   I have reviewed labs in epic and the results pertinent to this consultation are: Creatinine 1.5  I have reviewed the images obtained: MRI brain-no acute abnormality  Impression: 78 year old male with right-sided numbness and weakness that mostly resolved  after a few minutes.  But he still does have some persistent numbness, and the MRI is negative, I continue to think that cerebral ischemia is the most likely etiology given his risk factors and the description of the symptoms.  I think it is possible that he has a small brainstem stroke that is lost in the MRI noise that is not uncommon in the brainstem.  I would favor maximizing medical therapy including dual antiplatelet therapy for 3 weeks.  Recommendations: - HgbA1c, fasting lipid  panel - MRA  of the brain without contrast - Frequent neuro checks - Echocardiogram - Prophylactic therapy-Antiplatelet med: Aspirin - 81mg  and plavix 75mg  daily.  - Risk factor modification - Telemetry monitoring - PT consult, OT consult, Speech consult - Stroke team to follow   Roland Rack, MD [facility] [phone]  If 7pm- 7am, please page neurology on call as listed in [place].

## 2018-10-05 ENCOUNTER — Observation Stay (HOSPITAL_COMMUNITY): Payer: Medicare HMO

## 2018-10-05 ENCOUNTER — Observation Stay (HOSPITAL_BASED_OUTPATIENT_CLINIC_OR_DEPARTMENT_OTHER): Payer: Medicare HMO

## 2018-10-05 ENCOUNTER — Encounter (HOSPITAL_COMMUNITY): Payer: Self-pay | Admitting: *Deleted

## 2018-10-05 DIAGNOSIS — I129 Hypertensive chronic kidney disease with stage 1 through stage 4 chronic kidney disease, or unspecified chronic kidney disease: Secondary | ICD-10-CM | POA: Diagnosis not present

## 2018-10-05 DIAGNOSIS — I10 Essential (primary) hypertension: Secondary | ICD-10-CM | POA: Diagnosis not present

## 2018-10-05 DIAGNOSIS — E871 Hypo-osmolality and hyponatremia: Secondary | ICD-10-CM | POA: Diagnosis not present

## 2018-10-05 DIAGNOSIS — K219 Gastro-esophageal reflux disease without esophagitis: Secondary | ICD-10-CM | POA: Diagnosis not present

## 2018-10-05 DIAGNOSIS — I639 Cerebral infarction, unspecified: Secondary | ICD-10-CM | POA: Diagnosis not present

## 2018-10-05 DIAGNOSIS — G459 Transient cerebral ischemic attack, unspecified: Secondary | ICD-10-CM | POA: Diagnosis not present

## 2018-10-05 DIAGNOSIS — E1122 Type 2 diabetes mellitus with diabetic chronic kidney disease: Secondary | ICD-10-CM | POA: Diagnosis not present

## 2018-10-05 DIAGNOSIS — Z20828 Contact with and (suspected) exposure to other viral communicable diseases: Secondary | ICD-10-CM | POA: Diagnosis not present

## 2018-10-05 LAB — LIPID PANEL
Cholesterol: 145 mg/dL (ref 0–200)
HDL: 22 mg/dL — ABNORMAL LOW (ref >40)
LDL Cholesterol: 79 mg/dL (ref 0–99)
Total CHOL/HDL Ratio: 6.6 RATIO
Triglycerides: 218 mg/dL — ABNORMAL HIGH (ref <150)
VLDL: 44 mg/dL — ABNORMAL HIGH (ref 0–40)

## 2018-10-05 LAB — BASIC METABOLIC PANEL
Anion gap: 12 (ref 5–15)
Anion gap: 10 (ref 5–15)
BUN: 13 mg/dL (ref 8–23)
BUN: 11 mg/dL (ref 8–23)
CO2: 22 mmol/L (ref 22–32)
CO2: 23 mmol/L (ref 22–32)
Calcium: 8.4 mg/dL — ABNORMAL LOW (ref 8.9–10.3)
Calcium: 8.6 mg/dL — ABNORMAL LOW (ref 8.9–10.3)
Chloride: 95 mmol/L — ABNORMAL LOW (ref 98–111)
Chloride: 96 mmol/L — ABNORMAL LOW (ref 98–111)
Creatinine, Ser: 1.4 mg/dL — ABNORMAL HIGH (ref 0.61–1.24)
Creatinine, Ser: 1.41 mg/dL — ABNORMAL HIGH (ref 0.61–1.24)
GFR calc Af Amer: 55 mL/min — ABNORMAL LOW (ref >60)
GFR calc Af Amer: 55 mL/min — ABNORMAL LOW (ref >60)
GFR calc non Af Amer: 47 mL/min — ABNORMAL LOW (ref >60)
GFR calc non Af Amer: 47 mL/min — ABNORMAL LOW (ref >60)
Glucose, Bld: 86 mg/dL (ref 70–99)
Glucose, Bld: 133 mg/dL — ABNORMAL HIGH (ref 70–99)
Potassium: 3.2 mmol/L — ABNORMAL LOW (ref 3.5–5.1)
Potassium: 3.8 mmol/L (ref 3.5–5.1)
Sodium: 129 mmol/L — ABNORMAL LOW (ref 135–145)
Sodium: 129 mmol/L — ABNORMAL LOW (ref 135–145)

## 2018-10-05 LAB — GLUCOSE, CAPILLARY
Glucose-Capillary: 83 mg/dL (ref 70–99)
Glucose-Capillary: 101 mg/dL — ABNORMAL HIGH (ref 70–99)

## 2018-10-05 LAB — HEMOGLOBIN A1C
Hgb A1c MFr Bld: 6.1 % — ABNORMAL HIGH (ref 4.8–5.6)
Mean Plasma Glucose: 128.37 mg/dL

## 2018-10-05 LAB — OSMOLALITY, URINE: Osmolality, Ur: 281 mOsm/kg — ABNORMAL LOW (ref 300–900)

## 2018-10-05 LAB — SODIUM, URINE, RANDOM: Sodium, Ur: 49 mmol/L

## 2018-10-05 LAB — ECHOCARDIOGRAM COMPLETE
Height: 71 in
Weight: 3700.8 oz

## 2018-10-05 LAB — TROPONIN I (HIGH SENSITIVITY): Troponin I (High Sensitivity): 10 ng/L (ref <18)

## 2018-10-05 LAB — CREATININE, URINE, RANDOM: Creatinine, Urine: 65.39 mg/dL

## 2018-10-05 LAB — TSH: TSH: 1.503 u[IU]/mL (ref 0.350–4.500)

## 2018-10-05 MED ORDER — CLOPIDOGREL BISULFATE 75 MG PO TABS: 75.0000 mg | ORAL_TABLET | Freq: Every day | ORAL | 0 refills | Status: AC

## 2018-10-05 MED ORDER — ATORVASTATIN CALCIUM 40 MG PO TABS: 40.0000 mg | ORAL_TABLET | Freq: Every day | ORAL | 0 refills | Status: AC

## 2018-10-05 MED ORDER — SODIUM CHLORIDE 0.9 % IV SOLN
INTRAVENOUS | Status: DC
  Administered 2018-10-05 (×2): via INTRAVENOUS

## 2018-10-05 MED ORDER — CLOPIDOGREL BISULFATE 75 MG PO TABS
300.0000 mg | ORAL_TABLET | Freq: Once | ORAL | Status: AC
  Administered 2018-10-05: 300 mg via ORAL
  Filled 2018-10-05: qty 4

## 2018-10-05 MED ORDER — METFORMIN HCL 500 MG PO TABS: 500.0000 mg | ORAL_TABLET | Freq: Two times a day (BID) | ORAL | Status: AC

## 2018-10-05 MED ORDER — ASPIRIN EC 81 MG PO TBEC
81.0000 mg | DELAYED_RELEASE_TABLET | Freq: Every day | ORAL | Status: DC
  Administered 2018-10-05: 81 mg via ORAL
  Filled 2018-10-05: qty 1

## 2018-10-05 MED ORDER — CLOPIDOGREL BISULFATE 75 MG PO TABS: 75.0000 mg | ORAL_TABLET | Freq: Every day | ORAL | Status: DC

## 2018-10-05 NOTE — TOC Initial Note (Signed)
Transition of Care [facility]) - Initial/Assessment Note    Patient Details  Name: Corey Lang MRN: [id number] Date of Birth: 1940/05/03  Transition of [facility]) CM/SW Contact:    Bartholomew Crews, RN Phone Number: [phone] 10/05/2018, 1:42 PM  Clinical Narrative:                 Spoke with patient at bedside. PTA home with spouse. States that spouse will pick him up when ready to transition home, and is hoping to go home today. No problems identified with medications or medical appointments. No transition of care needs identified.   Expected Discharge Plan: Home/Self Care Barriers to Discharge: Continued Medical Work up   Patient Goals and CMS Choice     Choice offered to / list presented to : NA  Expected Discharge Plan and Services Expected Discharge Plan: Home/Self Care In-house Referral: NA Discharge Planning Services: NA, CM Consult Post Acute Care Choice: NA Living arrangements for the past 2 months: Single Family Home Expected Discharge Date: 10/05/18               DME Arranged: N/A DME Agency: NA       HH Arranged: NA HH Agency: NA        Prior Living Arrangements/Services Living arrangements for the past 2 months: Single Family Home Lives with:: Self, Spouse Patient language and need for interpreter reviewed:: Yes              Criminal Activity/Legal Involvement Pertinent to Current Situation/Hospitalization: No - Comment as needed  Activities of Daily Living Home Assistive Devices/Equipment: Eyeglasses, CBG Meter, Blood pressure cuff ADL Screening (condition at time of admission) Patient's cognitive ability adequate to safely complete daily activities?: Yes Is the patient deaf or have difficulty hearing?: No Does the patient have difficulty seeing, even when wearing glasses/contacts?: No Does the patient have difficulty concentrating, remembering, or making decisions?: No Patient able to express need for assistance with ADLs?: Yes Does the patient  have difficulty dressing or bathing?: No Independently performs ADLs?: Yes (appropriate for developmental age) Does the patient have difficulty walking or climbing stairs?: No Weakness of Legs: Right Weakness of Arms/Hands: Right(just nunmness per pt.)  Permission Sought/Granted                  Emotional Assessment Appearance:: Appears stated age Attitude/Demeanor/Rapport: Engaged Affect (typically observed): Accepting Orientation: : Oriented to Situation, Oriented to  Time, Oriented to Place, Oriented to Self   Psych Involvement: No (comment)  Admission diagnosis:  Acute ischemic stroke [facility]) [I63.9] Patient Active Problem List   Diagnosis Date Noted  . TIA (transient ischemic attack) 10/04/2018  . Type II diabetes mellitus with renal manifestations ([place]) 10/04/2018  . Hypertension   . HLD (hyperlipidemia)   . CKD (chronic kidney disease), stage III ([place])   . Hyponatremia   . LBBB (left bundle branch block)   . GERD (gastroesophageal reflux disease)    PCP:  Reita Cliche, MD Pharmacy:   [facility] [place], [place] [address] Phone: [phone] Fax: [phone]     Social Determinants of Health (SDOH) Interventions    Readmission Risk Interventions No flowsheet data found.

## 2018-10-05 NOTE — Evaluation (Addendum)
Occupational Therapy Evaluation and Discharge Patient Details Name: Corey Lang MRN: [id number] DOB: 07/05/1940 Today's Date: 10/05/2018    History of Present Illness Corey Lang is a 78 y.o. 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 MRI negative for acute event   Clinical Impression   This 78 yo male admitted with above presents to acute OT back to his baseline other than his RUE and LE down to his knee still feels numb. No further OT needs, we will sign off.    Follow Up Recommendations  No OT follow up    Equipment Recommendations  None recommended by OT       Precautions / Restrictions Precautions Precautions: None Restrictions Weight Bearing Restrictions: No      Mobility Bed Mobility Overal bed mobility: Independent                Transfers Overall transfer level: Needs assistance Equipment used: None Transfers: Sit to/from Stand Sit to Stand: Supervision              Balance Overall balance assessment: Needs assistance Sitting-balance support: No upper extremity supported;Feet supported Sitting balance-Leahy Scale: Good     Standing balance support: No upper extremity supported Standing balance-Leahy Scale: Fair                             ADL either performed or assessed with clinical judgement   ADL Overall ADL's : Independent                                        Did advise pt that since his RUE still feels numb I would recommend that he avoid holding heavy and/or objects that have something hot in them in RUE.     Vision Patient Visual Report: No change from baseline              Pertinent Vitals/Pain Pain Assessment: No/denies pain     Hand Dominance Left   Extremity/Trunk Assessment Upper Extremity  Assessment Upper Extremity Assessment: Overall WFL for tasks assessed(pt still reports numbness of whole right arm but can use if functionally and is not dropping items)           Communication Communication Communication: No difficulties   Cognition Arousal/Alertness: Awake/alert Behavior During Therapy: WFL for tasks assessed/performed Overall Cognitive Status: Within Functional Limits for tasks assessed                                                Home Living Family/patient expects to be discharged to:: Private residence Living Arrangements: Spouse/significant other Available Help at Discharge: Family;Available 24 hours/day Type of Home: House Home Access: Stairs to enter [employer] of Steps: 2 Entrance Stairs-Rails: Can reach both Home Layout: One level     Bathroom Shower/Tub: Walk-in shower;Door   [employer] Toilet: Standard     Home Equipment: Grab bars - tub/shower;Wheelchair - [employer] - single point      Lives With: Spouse    Prior Functioning/Environment Level of Independence: Independent        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        OT Problem List: Impaired sensation         OT Goals(Current goals can be found in the care plan section) Acute Rehab OT Goals Patient Stated Goal: to get back to normal routine  OT Frequency:             Co-evaluation PT/OT/SLP Co-Evaluation/Treatment: Yes Reason for Co-Treatment: For patient/therapist safety;To address functional/ADL transfers PT goals addressed during session: Mobility/safety with mobility;Balance;Strengthening/ROM OT goals addressed during session: ADL's and self-care;Strengthening/ROM      AM-PAC OT "6 Clicks" Daily Activity     Outcome Measure Help from another person eating meals?: None Help from another person taking care of personal grooming?: None Help from another person toileting, which includes using toliet, bedpan, or urinal?:  None Help from another person bathing (including washing, rinsing, drying)?: None Help from another person to put on and taking off regular upper body clothing?: None Help from another person to put on and taking off regular lower body clothing?: None 6 Click Score: 24   End of Session Equipment Utilized During Treatment: Gait belt  Activity Tolerance: Patient tolerated treatment well Patient left: in chair;with call bell/phone within reach;with chair alarm set  OT Visit Diagnosis: Other (comment)(numbness)                Time: 0240-9735 OT Time Calculation (min): 18 min Charges:  OT General Charges $OT Visit: 1 Visit OT Evaluation $OT Eval Moderate Complexity: 1 Mod  [place], OTR/L Acute [employer] Pager [phone] Office [phone]     Almon Register 10/05/2018, 12:40 PM

## 2018-10-05 NOTE — TOC Transition Note (Signed)
Transition of Care [facility]) - CM/SW Discharge Note   Patient Details  Name: Corey Lang MRN: [id number] Date of Birth: [birth year]  Transition of [facility]) CM/SW Contact:  Bartholomew Crews, RN Phone Number: [phone] 10/05/2018, 4:14 PM   Clinical Narrative:    Received call from bedside RN to advise of patient transitioning home and need for therapy. Noted case management consult for outpatient rehab. Referral placed. Information on AVS. No other transition of care needs identified.    Final next level of care: OP Rehab Barriers to Discharge: No Barriers Identified   Patient Goals and CMS Choice Patient states their goals for this hospitalization and ongoing recovery are:: ready to go home CMS Medicare.gov Compare Post Acute Care list provided to:: Patient Choice offered to / list presented to : Patient  Discharge Placement                       Discharge Plan and Services In-house Referral: NA Discharge Planning Services: NA, CM Consult Post Acute Care Choice: NA          DME Arranged: N/A DME Agency: NA       HH Arranged: NA HH Agency: NA        Social Determinants of Health (SDOH) Interventions     Readmission Risk Interventions No flowsheet data found.

## 2018-10-05 NOTE — CV Procedure (Signed)
2D echo attempted, but physical therapy came in while I was looking for a computer that actually worked. Will try echo later.

## 2018-10-05 NOTE — Progress Notes (Signed)
  Echocardiogram 2D Echocardiogram has been performed.  Corey Lang 10/05/2018, 2:12 PM

## 2018-10-05 NOTE — Evaluation (Signed)
Speech Language Pathology Evaluation Patient Details Name: Corey Lang MRN: [id number] DOB: 04/30/1940 Today's Date: 10/05/2018 Time: 0912-0929 SLP Time Calculation (min) (ACUTE ONLY): 17 min  Problem List:  Patient Active Problem List   Diagnosis Date Noted  . TIA (transient ischemic attack) 10/04/2018  . Type II diabetes mellitus with renal manifestations ([place]) 10/04/2018  . Hypertension   . HLD (hyperlipidemia)   . CKD (chronic kidney disease), stage III ([place])   . Hyponatremia   . LBBB (left bundle branch block)   . GERD (gastroesophageal reflux disease)    Past Medical History:  Past Medical History:  Diagnosis Date  . Anginal pain ([place])   . Anxiety   . Cancer ([place])   . Diabetes mellitus without complication ([place])   . Dysrhythmia    LBBB  . GERD (gastroesophageal reflux disease)   . High cholesterol   . High cholesterol   . History of kidney stones   . Hypertension   . Pre-diabetes   . Renal disorder   . Stroke ([place])   . TIA (transient ischemic attack)    Past Surgical History:  Past Surgical History:  Procedure Laterality Date  . NEPHRECTOMY    . TONSILLECTOMY     HPI:  Pt is a 78 y.o. 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 MRI was negative for acute changes.    Assessment / Plan / Recommendation Clinical Impression  Pt reported that COVID has recently forced him into retirement and was living independently with his wife prior to admission. He denied any baseline deficits in speech, language or cognition and stated that he believes his language and cognition are at baseline. He reported slightly slurred speech and indicated that he has to intentionally be precise in his speech. Per the pt this has improved compared to yesterday and is now approximately 90% back to baseline. His  language skills are currently within normal limits and no overt cognitive deficits were noted. He did present with minimal to mild dysarthria characterized by reduced articulatory precision which he consistently compensated for using overarticulation. Considering the pt's proximity to baseline and his independent use of compensatory strategies, further skilled SLP services are not clinically indicated at this time. Pt and both nurses were educated regarding results and recommendations; all parties verbalized understanding as well as agreement with plan of care.    SLP Assessment  SLP Recommendation/Assessment: Patient does not need any further Speech Lanaguage Pathology Services SLP Visit Diagnosis: Dysarthria and anarthria (R47.1)    Follow Up Recommendations  None    Frequency and Duration           SLP Evaluation Cognition  Overall Cognitive Status: Within Functional Limits for tasks assessed Arousal/Alertness: Awake/alert Orientation Level: Oriented X4 Attention: Focused;Sustained Focused Attention: Appears intact Sustained Attention: Appears intact Memory: Impaired Memory Impairment: Storage deficit;Decreased recall of new information(Immediate: 3/3; Delayed: 2/3; with cue: 1/1) Awareness: Appears intact Problem Solving: Appears intact(5/5)       Comprehension  Auditory Comprehension Overall Auditory Comprehension: Appears within functional limits for tasks assessed Yes/No Questions: Within Functional Limits Basic Biographical Questions: (5/5) Complex Questions: (5/5) Paragraph Comprehension (via yes/no questions): (3/4) Commands: Within Functional Limits Two Step Basic Commands: (4/4) Multistep Basic Commands: (4/4) Conversation: Complex    Expression Expression Primary Mode of Expression: Verbal Verbal Expression Overall Verbal Expression: Appears within functional limits for tasks assessed Initiation:  No impairment Automatic Speech: Counting;Day of week;Month of  year(WNL) Level of Generative/Spontaneous Verbalization: Conversation Repetition: No impairment(5/5) Naming: No impairment Responsive: (5/5) Confrontation: Within functional limits(10/10) Convergent: (5/5) Pragmatics: No impairment   Oral / Motor  Oral Motor/Sensory Function Overall Oral Motor/Sensory Function: Within functional limits Motor Speech Overall Motor Speech: Impaired(Approximately 90% back baseline per pt) Respiration: Within functional limits Phonation: Normal Resonance: Within functional limits Articulation: Impaired Level of Impairment: Conversation Intelligibility: Intelligible Motor Planning: Witnin functional limits Motor Speech Errors: Not applicable   Corey Lang I. Corey Lang, Corey Lang, Corey Lang Office number [phone] Pager [place] 10/05/2018, 9:42 AM

## 2018-10-05 NOTE — Progress Notes (Signed)
STROKE TEAM PROGRESS NOTE   HISTORY OF PRESENT ILLNESS (per record) Corey Lang is a 78 y.o. male with a history of hypertension, diabetes, hyperlipidemia who presents with transient right-sided weakness that started abruptly around 9:30 AM.  He states that his right side just gave out on him, and he was severely weak.  The severe symptoms lasted only for a few minutes and then rapidly improved.  By the time of arrival to [facility], his symptoms were no longer disabling and therefore TPA was not indicated.  He states that since his rapid improvement, he has had persistent mild right arm numbness from the shoulder to the thigh, as well as some milder right facial numbness.   LKW: 9:30 AM tpa given?: no, mild symptoms   SUBJECTIVE (INTERVAL HISTORY)  I have personally reviewed history of presenting illness with the patient.  He presented with recurrent right-sided numbness which appears to be improving.  MRI scan is negative for acute stroke.  MRI of the brain and neck both also unremarkable.  LDL cholesterol is slightly elevated at 79 mg percent and hemoglobin A1c 6.1.  2D echo is pending    OBJECTIVE Vitals:   10/05/18 0130 10/05/18 0339 10/05/18 0700 10/05/18 1100  BP: (!) 150/71 (!) 153/81 (!) 140/91 (!) 151/82  Pulse:  62 (!) 58 (!) 57  Resp: 16 16 16 16   Temp: 98.2 F (36.8 C) 97.7 F (36.5 C) 98.2 F (36.8 C) (!) 97.5 F (36.4 C)  TempSrc: Oral Oral Axillary Axillary  SpO2: 97% 98% 98% 96%  Weight:      Height:        CBC:  Recent Labs  Lab 10/04/18 1300  WBC 10.6*  NEUTROABS 7.8*  HGB 15.8  HCT 46.3  MCV 85.6  PLT 469    Basic Metabolic Panel:  Recent Labs  Lab 10/05/18 0546 10/05/18 1103  NA 129* 129*  K 3.2* 3.8  CL 95* 96*  CO2 22 23  GLUCOSE 86 133*  BUN 13 11  CREATININE 1.40* 1.41*  CALCIUM 8.4* 8.6*    Lipid Panel:     Component Value Date/Time   CHOL 145 10/05/2018 0546   TRIG 218 (H) 10/05/2018 0546   HDL 22 (L) 10/05/2018 0546    CHOLHDL 6.6 10/05/2018 0546   VLDL 44 (H) 10/05/2018 0546   LDLCALC 79 10/05/2018 0546   HgbA1c:  Lab Results  Component Value Date   HGBA1C 6.1 (H) 10/05/2018   Urine Drug Screen: No results found for: LABOPIA, COCAINSCRNUR, LABBENZ, AMPHETMU, THCU, LABBARB  Alcohol Level No results found for: [facility]  IMAGING  Mr Angio Head Wo Contrast 10/05/2018 IMPRESSION:  Negative intracranial MRA   Mr Angio Neck W Wo Contrast 10/04/2018 IMPRESSION:  1. No acute abnormality.  2. Chronic parenchymal volume loss with mild changes of small vessel disease.  3. 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  4. Normal MRA of the neck.   Mr Brain Wo Contrast 10/04/2018 IMPRESSION:  1. No acute abnormality.  2. Chronic parenchymal volume loss with mild changes of small vessel disease.  3. 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  4. Normal MRA of the neck.   Ct Head Code Stroke Wo Contrast 10/04/2018 IMPRESSION:  1. No acute finding by CT. Age related atrophy and chronic small-vessel change of the hemispheric white matter. Benign appearing 4 mm parenchymal calcification at the inferior right frontal lobe.  2. ASPECTS is 10.    Transthoracic Echocardiogram  00/00/2020 Pending  EKG SR rate 61 BPM. LBBB (See cardiology reading for complete details)    PHYSICAL EXAM Blood pressure (!) 151/82, pulse (!) 57, temperature (!) 97.5 F (36.4 C), temperature source Axillary, resp. rate 16, height 5\' 11"  (1.803 m), weight 104.9 kg, SpO2 96 %. Pleasant elderly male currently not in distress. . Afebrile. Head is nontraumatic. Neck is supple without bruit.    Cardiac exam no murmur or gallop. Lungs are clear to auscultation. Distal pulses are well felt.  Neurological Exam ;  Awake  Alert oriented x 3. Normal speech and language.eye movements full without nystagmus.fundi were not visualized. Vision acuity and fields appear normal. Hearing is normal. Palatal movements are normal. Face symmetric.  Tongue midline. Normal strength, tone, reflexes and coordination. Normal sensation. Gait deferred.      ASSESSMENT/PLAN Mr. Corey Lang is a 78 y.o. male with history of hypertension, angina, LBBB, CKD, diabetes, hyperlipidemia who presents with transient right-sided weakness . He did not receive IV t-PA due to improvided deficits.  Probable left hemispheric TIA from small vessel disease:    Resultant no deficits  CT head  - no acute findings.  MRI head -  - no acute findings.  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  2D Echo - pending  Lacey Jensen Virus 2  - negative  LDL - 79  HgbA1c - 6.1  UDS - not ordered  VTE prophylaxis - Lovenox  Diet - renal diet 1200 cc fluid restriction  aspirin 81 mg daily prior to admission, now on aspirin 81 mg daily and clopidogrel 75 mg daily  Patient counseled to be compliant with his antithrombotic medications  Ongoing aggressive stroke risk factor management  Therapy recommendations:  pending  Disposition:  Pending  Hypertension  Stable . Permissive hypertension (OK if < 220/120) but gradually normalize in 5-7 days . Long-term BP goal normotensive  Hyperlipidemia  Lipid lowering medication PTA:  Lipitor 10 mg daily  LDL 79, goal < 70  Current lipid lowering medication: Lipitor 80 mg daily  Continue statin at discharge  Diabetes  HgbA1c 6.1, goal < 7.0  Controlled  Other Stroke Risk Factors  Advanced age  Former cigarette smoker - quit  Obesity, Body mass index is 32.26 kg/m., recommend weight loss, diet and exercise as appropriate   Hx stroke/TIA  Coronary artery disease   Other Active Problems  Na - 129  CKD      Hospital day # 0 I have personally obtained history,examined this patient, reviewed notes, independently viewed imaging studies, participated in  medical decision making and plan of care.ROS completed by me personally and pertinent positives fully documented  I have made any additions or clarifications directly to the above note.  He presented with recurrent transient right-sided paresthesias likely from TIA due to small vessel disease.  Recommend aspirin and Plavix for 3 weeks followed by aspirin alone and aggressive risk factor modification.  Check echocardiogram results.  Long discussion with the patient and with Dr. Lonny Prude and answered questions.  Greater than 50% time during this 25-minute visit was spent on counseling and coordination of care about his TIA and review of stroke evaluation test results and answering questions.  Antony Contras, MD Medical Director [facility] Pager: [phone] 10/05/2018 2:15 PM   To contact Stroke Continuity provider, please refer to [email or web address] After hours, contact General Neurology

## 2018-10-05 NOTE — Plan of Care (Signed)
  Problem: Education: Goal: Knowledge of patient specific risk factors addressed and post discharge goals established will improve Outcome: Progressing   Problem: Education: Goal: Knowledge of secondary prevention will improve Outcome: Progressing   Problem: Education: Goal: Knowledge of disease or condition will improve Outcome: Progressing   

## 2018-10-05 NOTE — Discharge Summary (Signed)
Physician Discharge Summary  Corey Lang WEX:[id number] DOB: May 25, 1940 DOA: 10/04/2018  PCP: Reita Cliche, MD  Admit date: 10/04/2018 Discharge date: 10/05/2018  Admitted From: Home Disposition: Home  Recommendations for Outpatient Follow-up:  1. Follow up with PCP in 1 week 2. Please obtain BMP/CBC in one week 3. Please follow up on the following pending results: None  Home Health: Outpatient PT Equipment/Devices: None  Discharge Condition: Stable CODE STATUS: Full code Diet recommendation: Heart healthy   Brief/Interim Summary:  Admission HPI written by Ivor Costa, MD   Chief Complaint: Right-sided numbness and weakness  HPI: Corey Lang is a 78 y.o. 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  Patient states that his symptoms started at about 9:30 AM when he was walking. He states that he has numbness and weakness in both right arm and upper leg.  He also had possible slurred speech.  Patient states that his symptoms improved, but still has some numbness and weakness in both right arm and the leg.  He also has headache.  No vision loss or hearing loss.  Patient denies any chest pain, shortness breath, cough, fever or chills.  No nausea vomiting, diarrhea, abdominal pain, symptoms of UTI.  ED Course: pt was found to have WBC 10.6, INR 1.0, PTT 27, negative COVID-19 test, sodium 128, creatinine close to baseline, temperature normal, blood pressure 170/98, heart rate 58, RR 20s, oxygen saturation 94% on room air.  CT head is negative for acute intracranial abnormalities.  Telemetry neurology, Dr. Drema Halon was consulted by EDP, who did not think patient need TPA.   Hospital course:  TIA Likely from small vessel disease. CT/MRI head without acute findings. MRA head and neck significant  for 4 mm inferior frontal lobe cavernoma, otherwise normal. LDL of 79 with hemoglobin A1C of 6.1%. Transthoracic Echocardiogram without embolic source. Neurology consulted and recommended aspirin, Plavix x21 days, higher intensity Lipitor.  Essential hypertension Stable. Resume home regimen except hydrochlorothiazide.  Hyponatremia Possibly secondary to hydrochlorothiazide. Improved with cessation of hydrochlorothiazide and IV fluids. Will need recheck in 3-5 days.  CKD stage III Stable  Hyperlipidemia Increased to Lipitor 40 mg daily  Discharge Diagnoses:  Principal Problem:   TIA (transient ischemic attack) Active Problems:   Type II diabetes mellitus with renal manifestations (HCC)   Hypertension   HLD (hyperlipidemia)   CKD (chronic kidney disease), stage III (HCC)   Hyponatremia   LBBB (left bundle branch block)   GERD (gastroesophageal reflux disease)    Discharge Instructions   Allergies as of 10/05/2018   No Known Allergies     Medication List    STOP taking these medications   hydrochlorothiazide 25 MG tablet Commonly known as: HYDRODIURIL     TAKE these medications   aspirin EC 81 MG tablet Take 81 mg by mouth daily.   atorvastatin 40 MG tablet Commonly known as: LIPITOR Take 1 tablet (40 mg total) by mouth daily at 6 PM. What changed:   medication strength  how much to take  when to take this   clopidogrel 75 MG tablet Commonly known as: PLAVIX Take 1 tablet (75 mg total) by mouth daily for 21 days. Start taking on: October 06, 2018   finasteride 5 MG tablet Commonly known as: PROSCAR Take 5 mg by mouth daily.   LORazepam 0.5 MG tablet Commonly known as: ATIVAN Take  0.5 mg by mouth daily as needed for anxiety.   metFORMIN 500 MG tablet Commonly known as: GLUCOPHAGE Take 1 tablet (500 mg total) by mouth 2 (two) times daily with a meal. Start taking on: October 09, 2018 What changed:   how much to take  These instructions start on October 09, 2018. If you are unsure what to do until then, ask your doctor or other care provider.   metoprolol succinate 25 MG 24 hr tablet Commonly known as: TOPROL-XL Take 25 mg by mouth daily.   Omega-3 1000 MG Caps Take 1 tablet by mouth daily.   tamsulosin 0.4 MG Caps capsule Commonly known as: FLOMAX Take 0.4 mg by mouth.   valsartan 320 MG tablet Commonly known as: DIOVAN Take 320 mg by mouth daily.   Vitamin D-1000 Max St 25 MCG (1000 UT) tablet Generic drug: Cholecalciferol Take 1 tablet by mouth daily.   zolpidem 10 MG tablet Commonly known as: AMBIEN Take 10 mg by mouth at bedtime as needed for sleep.       No Known Allergies  Consultations:  Neurology   Procedures/Studies: Mr Angio Head Wo Contrast  Result Date: 10/05/2018 CLINICAL DATA:  TIA.  Right-sided numbness and weakness EXAM: MRA HEAD WITHOUT CONTRAST TECHNIQUE: Angiographic images of the Circle of Willis were obtained using MRA technique without intravenous contrast. COMPARISON:  Noncontrast brain MRI from yesterday FINDINGS: Symmetric carotid and vertebral arteries. Fetal type right PCA. No branch occlusion, flow limiting stenosis, or beading. Outpouchings rightward from the basilar and from the left supraclinoid ICA are attributed to infundibula based on source images. IMPRESSION: Negative intracranial MRA Electronically Signed   By: Monte Fantasia M.D.   On: 10/05/2018 06:58   Mr Angio Neck W Wo Contrast  Result Date: 10/04/2018 CLINICAL DATA:  Right-sided numbness and weakness EXAM: MRI HEAD WITHOUT CONTRAST MRA NECK WITHOUT AND WITH CONTRAST TECHNIQUE: Multiplanar, multiecho pulse sequences of the brain and surrounding structures were obtained without intravenous contrast. Angiographic images of the neck were obtained using MRA technique with and without intravenous contrast. Carotid stenosis measurements (when applicable) are obtained utilizing NASCET criteria, using the distal internal carotid diameter  as the denominator. CONTRAST:  10 mL Gadavist COMPARISON:  Head CT 10/04/2018 FINDINGS: MRI HEAD FINDINGS BRAIN: There is no acute infarct, acute hemorrhage or extra-axial collection. Multifocal white matter hyperintensity, most commonly due to chronic ischemic microangiopathy. There is generalized atrophy without lobar predilection. The midline structures are normal. There is a focus of magnetic susceptibility effect at the inferolateral right frontal lobe likely a cavernoma. VASCULAR: The major intracranial arterial and venous sinus flow voids are normal. SKULL AND UPPER CERVICAL SPINE: Calvarial bone marrow signal is normal. There is no skull base mass. The visualized upper cervical spine and soft tissues are normal. SINUSES/ORBITS: There are no fluid levels or advanced mucosal thickening. The mastoid air cells and middle ear cavities are free of fluid. The orbits are normal. MRA NECK FINDINGS There is no stenosis of the carotid or vertebral arteries. Course and caliber is normal.Visualization of the right vertebral artery origin is poor. IMPRESSION: 1. No acute abnormality. 2. Chronic parenchymal volume loss with mild changes of small vessel disease. 3. 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 4. Normal MRA of the neck. Electronically Signed   By: Ulyses Jarred M.D.   On: 10/04/2018 21:14   Mr Brain Wo Contrast  Result Date: 10/04/2018 CLINICAL DATA:  Right-sided numbness and weakness EXAM: MRI HEAD WITHOUT CONTRAST MRA NECK  WITHOUT AND WITH CONTRAST TECHNIQUE: Multiplanar, multiecho pulse sequences of the brain and surrounding structures were obtained without intravenous contrast. Angiographic images of the neck were obtained using MRA technique with and without intravenous contrast. Carotid stenosis measurements (when applicable) are obtained utilizing NASCET criteria, using the distal internal carotid diameter as the denominator. CONTRAST:  10 mL Gadavist COMPARISON:  Head CT 10/04/2018 FINDINGS: MRI  HEAD FINDINGS BRAIN: There is no acute infarct, acute hemorrhage or extra-axial collection. Multifocal white matter hyperintensity, most commonly due to chronic ischemic microangiopathy. There is generalized atrophy without lobar predilection. The midline structures are normal. There is a focus of magnetic susceptibility effect at the inferolateral right frontal lobe likely a cavernoma. VASCULAR: The major intracranial arterial and venous sinus flow voids are normal. SKULL AND UPPER CERVICAL SPINE: Calvarial bone marrow signal is normal. There is no skull base mass. The visualized upper cervical spine and soft tissues are normal. SINUSES/ORBITS: There are no fluid levels or advanced mucosal thickening. The mastoid air cells and middle ear cavities are free of fluid. The orbits are normal. MRA NECK FINDINGS There is no stenosis of the carotid or vertebral arteries. Course and caliber is normal.Visualization of the right vertebral artery origin is poor. IMPRESSION: 1. No acute abnormality. 2. Chronic parenchymal volume loss with mild changes of small vessel disease. 3. 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 4. Normal MRA of the neck. Electronically Signed   By: Ulyses Jarred M.D.   On: 10/04/2018 21:14   Ct Head Code Stroke Wo Contrast  Result Date: 10/04/2018 CLINICAL DATA:  Code stroke. Numbness and tingling of the right arm with right-sided weakness beginning today. EXAM: CT HEAD WITHOUT CONTRAST TECHNIQUE: Contiguous axial images were obtained from the base of the skull through the vertex without intravenous contrast. COMPARISON:  None. FINDINGS: Brain: Generalized age related atrophy. No focal insult seen affecting the brainstem or cerebellum. Cerebral hemispheres show chronic small-vessel ischemic changes of the white matter. Focal 4 mm parenchymal calcification at the inferior right frontal lobe. No sign of mass effect or edema. No hemorrhage, hydrocephalus or extra-axial collection. Vascular: There is  atherosclerotic calcification of the major vessels at the base of the brain. Skull: Negative Sinuses/Orbits: Clear/normal Other: None ASPECTS ([place] Stroke Program Early CT Score) - Ganglionic level infarction (caudate, lentiform nuclei, internal capsule, insula, M1-M3 cortex): 7 - Supraganglionic infarction (M4-M6 cortex): 3 Total score (0-10 with 10 being normal): 10 IMPRESSION: 1. No acute finding by CT. Age related atrophy and chronic small-vessel change of the hemispheric white matter. Benign appearing 4 mm parenchymal calcification at the inferior right frontal lobe. 2. ASPECTS is 10. 3. These results were called by telephone at the time of interpretation on 10/04/2018 at 1:05 pm to Dr. Gerlene Fee , who verbally acknowledged these results. Electronically Signed   By: Nelson Chimes M.D.   On: 10/04/2018 13:08     Transthoracic Echocardiogram (10/05/2018) IMPRESSIONS    1. The left ventricle has low normal systolic function, with an ejection fraction of 50-55%. The cavity size was normal. There is moderate concentric left ventricular hypertrophy. Left ventricular diastolic Doppler parameters are consistent with  impaired relaxation. Left ventricular diffuse hypokinesis.  2. The right ventricle has normal systolic function. The cavity was normal. There is no increase in right ventricular wall thickness.  3. Severely thickening of the aortic valve. Severe calcifcation of the aortic valve. Aortic valve regurgitation was not assessed by color flow Doppler. Mild stenosis of the aortic valve.  4.  The aorta is abnormal in size and structure.  5. The ascending aorta is normal in size and structure.  6. 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   Subjective: Back pain is better this morning.  Discharge Exam: Vitals:   10/05/18 0339 10/05/18 0700  BP: (!) 153/81 (!) 140/91  Pulse: 62 (!) 58  Resp: 16 16  Temp: 97.7 F (36.5 C) 98.2 F (36.8 C)  SpO2: 98% 98%   Vitals:    10/04/18 2320 10/05/18 0130 10/05/18 0339 10/05/18 0700  BP: (!) 171/87 (!) 150/71 (!) 153/81 (!) 140/91  Pulse: 62  62 (!) 58  Resp: 20 16 16 16   Temp: 98 F (36.7 C) 98.2 F (36.8 C) 97.7 F (36.5 C) 98.2 F (36.8 C)  TempSrc: Oral Oral Oral Axillary  SpO2: 96% 97% 98% 98%  Weight:      Height:        General: Pt is alert, awake, not in acute distress Cardiovascular: RRR, S1/S2 +, no rubs, no gallops Respiratory: CTA bilaterally, no wheezing, no rhonchi Abdominal: Soft, NT, ND, bowel sounds + Extremities: no edema, no cyanosis    The results of significant diagnostics from this hospitalization (including imaging, microbiology, ancillary and laboratory) are listed below for reference.     Microbiology: Recent Results (from the past 240 hour(s))  SARS Coronavirus 2 (Performed in [facility] hospital lab)     Status: None   Collection Time: 10/04/18  1:01 PM   Specimen: Nasopharyngeal Swab  Result Value Ref Range Status   SARS Coronavirus 2 NEGATIVE NEGATIVE Final    Comment: (NOTE) If result is NEGATIVE SARS-CoV-2 target nucleic acids are NOT DETECTED. The SARS-CoV-2 RNA is generally detectable in upper and lower  respiratory specimens during the acute phase of infection. The lowest  concentration of SARS-CoV-2 viral copies this assay can detect is 250  copies / mL. A negative result does not preclude SARS-CoV-2 infection  and should not be used as the sole basis for treatment or other  patient management decisions.  A negative result may occur with  improper specimen collection / handling, submission of specimen other  than nasopharyngeal swab, presence of viral mutation(s) within the  areas targeted by this assay, and inadequate number of viral copies  (<250 copies / mL). A negative result must be combined with clinical  observations, patient history, and epidemiological information. If result is POSITIVE SARS-CoV-2 target nucleic acids are DETECTED. The SARS-CoV-2  RNA is generally detectable in upper and lower  respiratory specimens dur ing the acute phase of infection.  Positive  results are indicative of active infection with SARS-CoV-2.  Clinical  correlation with patient history and other diagnostic information is  necessary to determine patient infection status.  Positive results do  not rule out bacterial infection or co-infection with other viruses. If result is PRESUMPTIVE POSTIVE SARS-CoV-2 nucleic acids MAY BE PRESENT.   A presumptive positive result was obtained on the submitted specimen  and confirmed on repeat testing.  While 2019 novel coronavirus  (SARS-CoV-2) nucleic acids may be present in the submitted sample  additional confirmatory testing may be necessary for epidemiological  and / or clinical management purposes  to differentiate between  SARS-CoV-2 and other Sarbecovirus currently known to infect humans.  If clinically indicated additional testing with an alternate test  methodology [id number]) is advised. The SARS-CoV-2 RNA is generally  detectable in upper and lower respiratory sp ecimens during the acute  phase of infection. The  expected result is Negative. Fact Sheet for Patients:  StrictlyIdeas.no Fact Sheet for Healthcare Providers: BankingDealers.co.za This test is not yet approved or cleared by the [place] FDA and has been authorized for detection and/or diagnosis of SARS-CoV-2 by FDA under an Emergency Use Authorization (EUA).  This EUA will remain in effect (meaning this test can be used) for the duration of the COVID-19 declaration under Section 564(b)(1) of the Act, 21 U.S.C. section 360bbb-3(b)(1), unless the authorization is terminated or revoked sooner. Performed at [facility], [address] [place], [place] [address]      Labs: BNP (last 3 results) No results for input(s): BNP in the last 8760 hours. Basic Metabolic Panel: Recent Labs   Lab 10/04/18 1300 10/04/18 1945 10/05/18 0546  NA 128* 124* 129*  K 3.6  --  3.2*  CL 92*  --  95*  CO2 22  --  22  GLUCOSE 105*  --  86  BUN 14  --  13  CREATININE 1.50*  --  1.40*  CALCIUM 8.9  --  8.4*   Liver Function Tests: Recent Labs  Lab 10/04/18 1300  AST 27  ALT 46*  ALKPHOS 44  BILITOT 1.2  PROT 6.8  ALBUMIN 4.2   No results for input(s): LIPASE, AMYLASE in the last 168 hours. No results for input(s): AMMONIA in the last 168 hours. CBC: Recent Labs  Lab 10/04/18 1300  WBC 10.6*  NEUTROABS 7.8*  HGB 15.8  HCT 46.3  MCV 85.6  PLT 217   Cardiac Enzymes: No results for input(s): CKTOTAL, CKMB, CKMBINDEX, TROPONINI in the last 168 hours. BNP: Invalid input(s): POCBNP CBG: Recent Labs  Lab 10/04/18 1252 10/04/18 2115 10/05/18 0604  GLUCAP 106* 106* 83   D-Dimer No results for input(s): DDIMER in the last 72 hours. Hgb A1c Recent Labs    10/05/18 0546  HGBA1C 6.1*   Lipid Profile Recent Labs    10/05/18 0546  CHOL 145  HDL 22*  LDLCALC 79  TRIG 218*  CHOLHDL 6.6   Thyroid function studies Recent Labs    10/05/18 0546  TSH 1.503   Anemia work up No results for input(s): VITAMINB12, FOLATE, FERRITIN, TIBC, IRON, RETICCTPCT in the last 72 hours. Urinalysis No results found for: COLORURINE, APPEARANCEUR, [place], [place], [place], [place], [place], [place], PROTEINUR, UROBILINOGEN, NITRITE, LEUKOCYTESUR Sepsis Labs Invalid input(s): PROCALCITONIN,  WBC,  LACTICIDVEN Microbiology Recent Results (from the past 240 hour(s))  SARS Coronavirus 2 (Performed in [facility] hospital lab)     Status: None   Collection Time: 10/04/18  1:01 PM   Specimen: Nasopharyngeal Swab  Result Value Ref Range Status   SARS Coronavirus 2 NEGATIVE NEGATIVE Final    Comment: (NOTE) If result is NEGATIVE SARS-CoV-2 target nucleic acids are NOT DETECTED. The SARS-CoV-2 RNA is generally detectable in upper and lower  respiratory specimens during  the acute phase of infection. The lowest  concentration of SARS-CoV-2 viral copies this assay can detect is 250  copies / mL. A negative result does not preclude SARS-CoV-2 infection  and should not be used as the sole basis for treatment or other  patient management decisions.  A negative result may occur with  improper specimen collection / handling, submission of specimen other  than nasopharyngeal swab, presence of viral mutation(s) within the  areas targeted by this assay, and inadequate number of viral copies  (<250 copies / mL). A negative result must be combined with clinical  observations, patient history,  and epidemiological information. If result is POSITIVE SARS-CoV-2 target nucleic acids are DETECTED. The SARS-CoV-2 RNA is generally detectable in upper and lower  respiratory specimens dur ing the acute phase of infection.  Positive  results are indicative of active infection with SARS-CoV-2.  Clinical  correlation with patient history and other diagnostic information is  necessary to determine patient infection status.  Positive results do  not rule out bacterial infection or co-infection with other viruses. If result is PRESUMPTIVE POSTIVE SARS-CoV-2 nucleic acids MAY BE PRESENT.   A presumptive positive result was obtained on the submitted specimen  and confirmed on repeat testing.  While 2019 novel coronavirus  (SARS-CoV-2) nucleic acids may be present in the submitted sample  additional confirmatory testing may be necessary for epidemiological  and / or clinical management purposes  to differentiate between  SARS-CoV-2 and other Sarbecovirus currently known to infect humans.  If clinically indicated additional testing with an alternate test  methodology [id number]) is advised. The SARS-CoV-2 RNA is generally  detectable in upper and lower respiratory sp ecimens during the acute  phase of infection. The expected result is Negative. Fact Sheet for Patients:   StrictlyIdeas.no Fact Sheet for Healthcare Providers: BankingDealers.co.za This test is not yet approved or cleared by the [place] FDA and has been authorized for detection and/or diagnosis of SARS-CoV-2 by FDA under an Emergency Use Authorization (EUA).  This EUA will remain in effect (meaning this test can be used) for the duration of the COVID-19 declaration under Section 564(b)(1) of the Act, 21 U.S.C. section 360bbb-3(b)(1), unless the authorization is terminated or revoked sooner. Performed at [facility], [address], [place], [place] [address]       SIGNED:   Cordelia Poche, MD [facility] 10/05/2018, 9:12 AM

## 2018-10-05 NOTE — Evaluation (Signed)
Physical Therapy Evaluation Patient Details Name: Corey Lang MRN: [id number] DOB: January 06, 1941 Today's Date: 10/05/2018   History of Present Illness  Corey Lang is a 78 y.o. 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 MRI negative for acute event    Clinical Impression  Patient admitted with the above. Patient reports independence at baseline for mobility and ADLs. Patient today requiring Min guard to supervision with mobility for safety. Does report baseline weakness at R hip - will recommend OPPT at discharge for this. PT to follow acutely to maximize safe and independent functional mobility prior to return home.      Follow Up Recommendations Outpatient PT    Equipment Recommendations  None recommended by PT    Recommendations for Other Services       Precautions / Restrictions Precautions Precautions: Fall Restrictions Weight Bearing Restrictions: No      Mobility  Bed Mobility Overal bed mobility: Independent                Transfers Overall transfer level: Needs assistance Equipment used: None Transfers: Sit to/from Stand Sit to Stand: Supervision         General transfer comment: for safety  Ambulation/Gait Ambulation/Gait assistance: Supervision;Min guard Gait Distance (Feet): 120 Feet Assistive device: None Gait Pattern/deviations: Step-through pattern;Decreased stride length Gait velocity: decreased   General Gait Details: mild instability due to reported R leglength discrepency. No LOB or need for physical assist  Stairs            Wheelchair Mobility    Modified Rankin (Stroke Patients Only) Modified Rankin (Stroke Patients Only) Pre-Morbid Rankin Score: No symptoms Modified Rankin: Moderate disability     Balance Overall balance assessment: Needs  assistance Sitting-balance support: No upper extremity supported;Feet supported Sitting balance-Leahy Scale: Good     Standing balance support: No upper extremity supported;During functional activity Standing balance-Leahy Scale: Fair                               Pertinent Vitals/Pain Pain Assessment: No/denies pain    Home Living Family/patient expects to be discharged to:: Private residence Living Arrangements: Spouse/significant other Available Help at Discharge: Family;Available 24 hours/day Type of Home: House Home Access: Stairs to enter Entrance Stairs-Rails: Can reach both Entrance Stairs-Number of Steps: 2 Home Layout: One level Home Equipment: Grab bars - tub/shower;Wheelchair - manual;Cane - single point      Prior Function Level of Independence: Independent         Comments: 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   Dominant Hand: Left    Extremity/Trunk Assessment   Upper Extremity Assessment Upper Extremity Assessment: Defer to OT evaluation    Lower Extremity Assessment Lower Extremity Assessment: Overall WFL for tasks assessed(mild numbness to R thigh)    Cervical / Trunk Assessment Cervical / Trunk Assessment: Normal  Communication   Communication: No difficulties  Cognition Arousal/Alertness: Awake/alert Behavior During Therapy: WFL for tasks assessed/performed Overall Cognitive Status: Within Functional Limits for tasks assessed                                        General Comments      Exercises  Assessment/Plan    PT Assessment Patient needs continued PT services  PT Problem List Decreased strength;Decreased activity tolerance;Decreased balance;Decreased mobility;Decreased knowledge of use of DME;Decreased safety awareness       PT Treatment Interventions DME instruction;Gait training;Stair training;Functional mobility training;Therapeutic activities;Therapeutic  exercise;Balance training;Patient/family education    PT Goals (Current goals can be found in the Care Plan section)  Acute Rehab PT Goals Patient Stated Goal: to get back to normal routine PT Goal Formulation: With patient Time For Goal Achievement: 10/19/18 Potential to Achieve Goals: Good    Frequency Min 4X/week   Barriers to discharge        Co-evaluation PT/OT/SLP Co-Evaluation/Treatment: Yes Reason for Co-Treatment: For patient/therapist safety;To address functional/ADL transfers PT goals addressed during session: Mobility/safety with mobility;Balance OT goals addressed during session: ADL's and self-care;Strengthening/ROM       AM-PAC PT "6 Clicks" Mobility  Outcome Measure Help needed turning from your back to your side while in a flat bed without using bedrails?: None Help needed moving from lying on your back to sitting on the side of a flat bed without using bedrails?: None Help needed moving to and from a bed to a chair (including a wheelchair)?: A Little Help needed standing up from a chair using your arms (e.g., wheelchair or bedside chair)?: A Little Help needed to walk in hospital room?: A Little Help needed climbing 3-5 steps with a railing? : A Little 6 Click Score: 20    End of Session Equipment Utilized During Treatment: Gait belt Activity Tolerance: Patient tolerated treatment well Patient left: in chair;with call bell/phone within reach;with chair alarm set Nurse Communication: Mobility status PT Visit Diagnosis: Unsteadiness on feet (R26.81);Other abnormalities of gait and mobility (R26.89);Muscle weakness (generalized) (M62.81)    Time: 3524-8185 PT Time Calculation (min) (ACUTE ONLY): 18 min   Charges:   PT Evaluation $PT Eval Low Complexity: 1 Low      Lanney Gins, PT, DPT Supplemental Physical Therapist 10/05/18 1:40 PM Pager: [phone] Office: [phone]

## 2018-10-05 NOTE — Progress Notes (Signed)
Pt  Off unit for MRA of neck

## 2018-10-05 NOTE — Care Management Obs Status (Signed)
[place] NOTIFICATION   Patient Details  Name: Kalyb Pemble MRN: [id number] Date of Birth: [birth year]   Medicare Observation Status Notification Given:  Yes    Bartholomew Crews, RN 10/05/2018, 1:20 PM

## 2018-10-05 NOTE — Discharge Instructions (Signed)
Corey Lang,  You were in the hospital and likely had a TIA. You were evaluated by the neurologist and have changes to your medications. Please take as prescribed and follow-up with the neurologist.

## 2018-10-15 ENCOUNTER — Ambulatory Visit: Payer: Medicare HMO | Attending: Physical Therapy | Admitting: Physical Therapy

## 2018-11-26 ENCOUNTER — Encounter: Payer: Self-pay | Admitting: Adult Health

## 2018-11-26 ENCOUNTER — Ambulatory Visit (INDEPENDENT_AMBULATORY_CARE_PROVIDER_SITE_OTHER): Payer: Medicare HMO | Admitting: Adult Health

## 2018-11-26 ENCOUNTER — Other Ambulatory Visit: Payer: Self-pay

## 2018-11-26 VITALS — BP 125/89 | HR 90 | Temp 97.5°F | Ht 71.0 in | Wt 230.8 lb

## 2018-11-26 DIAGNOSIS — I10 Essential (primary) hypertension: Secondary | ICD-10-CM | POA: Diagnosis not present

## 2018-11-26 DIAGNOSIS — E785 Hyperlipidemia, unspecified: Secondary | ICD-10-CM | POA: Diagnosis not present

## 2018-11-26 DIAGNOSIS — G459 Transient cerebral ischemic attack, unspecified: Secondary | ICD-10-CM

## 2018-11-26 DIAGNOSIS — R2689 Other abnormalities of gait and mobility: Secondary | ICD-10-CM

## 2018-11-26 DIAGNOSIS — R29818 Other symptoms and signs involving the nervous system: Secondary | ICD-10-CM | POA: Diagnosis not present

## 2018-11-26 DIAGNOSIS — E119 Type 2 diabetes mellitus without complications: Secondary | ICD-10-CM

## 2018-11-26 NOTE — Progress Notes (Signed)
[facility] [address] [place]. [place] [address] [phone]       HOSPITAL FOLLOW UP NOTE  Mr. Theodora Regis Date of Birth:  [birth year] Medical Record Number:  [id number]   Reason for Referral:  hospital stroke follow up    CHIEF COMPLAINT:  Chief Complaint  Patient presents with  . Hospitalization Follow-up    Alone. Treatment room. Patient mentioned that he has gone downhill since being home from the hospital. He mentioned that he has some generalized weakness and his gait is off.     HPI: Cauy Bussell being seen today for in office hospital follow-up regarding TIA on 10/04/2018.  History obtained from patient and chart review. Reviewed all radiology images and labs personally.  Mr. Keyondre Fattore is a 78 y.o. male with history of hypertension, angina, LBBB, CKD, diabetes, hyperlipidemia who presented to [facility] ED on 10/04/2018 with transient right-sided weakness . He did not receive IV t-PA due to improvided deficits.  Neurology consulted with diagnosis of probable left hemispheric TIA secondary to small vessel disease.  CT head and MRI head unremarkable.  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  2D echo unremarkable.  LDL 79.  A1c 6.1.  Recommended DAPT for 3 weeks and aspirin alone.  HTN stable.  Recommended increased dose of atorvastatin 40 mg daily.  DM controlled.  Other stroke risk factors include advanced age, former tobacco use, obesity, prior history of stroke and CAD.  Discharged home in stable condition without therapy needs.  Mr. Biese is being seen today for hospital follow-up.  He states initially at discharge, he was doing well in regards to his overall functioning but after approximately 2 weeks, he started to experience worsening of right-sided weakness and balance difficulties.  He also noticed increased fatigue with exertion and decreased endurance.  He was initially ambulating at least 30 minutes daily  but is currently only able to ambulate 15 to 20 minutes and then will have to rest.  He has recently started to have to use a cane to assist with his balance.  He denies any other neurological symptoms.  He will be starting physical therapy tomorrow which was ordered by his PCP for gait difficulties.   He has completed 3 weeks DAPT and continues on Plavix alone without bleeding or bruising.  Continues on atorvastatin without myalgias.  Blood pressure today satisfactory 125/89.  He continues to follow with PCP for management with discontinuing hydrochlorothiazide after he returned home from hospitalization with increase of blood pressure typically ranging he does endorse blood pressure elevated 200s/100s.  Hydrochlorothiazide restarted with continued elevated blood pressures for approximately 1 week.  He did undergo 1 week cardiac monitor which was recommended by PCP due to recent TIA which was unremarkable.  Denies prior symptoms of fatigue, insomnia or snoring.  No further concerns at this time.      ROS:   14 system review of systems performed and negative with exception of fatigue, weakness and dizziness/balance  PMH:  Past Medical History:  Diagnosis Date  . Anginal pain ([place])   . Anxiety   . Cancer ([place])   . Diabetes mellitus without complication ([place])   . Dysrhythmia    LBBB  . GERD (gastroesophageal reflux disease)   . High cholesterol   . High cholesterol   . History of kidney stones   . Hypertension   . Pre-diabetes   . Renal disorder   . Stroke ([place])   .  TIA (transient ischemic attack)     PSH:  Past Surgical History:  Procedure Laterality Date  . NEPHRECTOMY    . TONSILLECTOMY      Social History:  Social History   Socioeconomic History  . Marital status: Married    Spouse name: Not on file  . Number of children: Not on file  . Years of education: Not on file  . Highest education level: Not on file  Occupational History  . Not on file  Social Needs  .  Financial resource strain: Not on file  . Food insecurity    Worry: Not on file    Inability: Not on file  . Transportation needs    Medical: Not on file    Non-medical: Not on file  Tobacco Use  . Smoking status: Former Research scientist (life sciences)  . Smokeless tobacco: Never Used  Substance and Sexual Activity  . Alcohol use: Never    Frequency: Never  . Drug use: Never  . Sexual activity: Not on file  Lifestyle  . Physical activity    Days per week: Not on file    Minutes per session: Not on file  . Stress: Not on file  Relationships  . Social Herbalist on phone: Not on file    Gets together: Not on file    Attends religious service: Not on file    Active member of club or organization: Not on file    Attends meetings of clubs or organizations: Not on file    Relationship status: Not on file  . Intimate partner violence    Fear of current or ex partner: Not on file    Emotionally abused: Not on file    Physically abused: Not on file    Forced sexual activity: Not on file  Other Topics Concern  . Not on file  Social History Narrative  . Not on file    Family History: History reviewed. No pertinent family history.  Medications:   Current Outpatient Medications on File Prior to Visit  Medication Sig Dispense Refill  . atorvastatin (LIPITOR) 40 MG tablet Take 1 tablet (40 mg total) by mouth daily at 6 PM. 30 tablet 0  . Cholecalciferol (VITAMIN D-1000 MAX ST) 25 MCG (1000 UT) tablet Take 1 tablet by mouth daily.    . clopidogrel (PLAVIX) 75 MG tablet Take 75 mg by mouth daily.    . finasteride (PROSCAR) 5 MG tablet Take 5 mg by mouth daily.    . hydrochlorothiazide (HYDRODIURIL) 25 MG tablet Take 2.5 mg by mouth daily.    Marland Kitchen LORazepam (ATIVAN) 0.5 MG tablet Take 0.5 mg by mouth as needed for anxiety.     . metFORMIN (GLUCOPHAGE) 500 MG tablet Take 1 tablet (500 mg total) by mouth 2 (two) times daily with a meal.    . metoprolol succinate (TOPROL-XL) 25 MG 24 hr tablet Take 25 mg  by mouth daily.    . montelukast (SINGULAIR) 10 MG tablet Take 10 mg by mouth at bedtime.    . Omega-3 1000 MG CAPS Take 1 tablet by mouth daily.    . RABEprazole (ACIPHEX) 20 MG tablet Take 20 mg by mouth daily.    . tamsulosin (FLOMAX) 0.4 MG CAPS capsule Take 0.4 mg by mouth.    . valsartan (DIOVAN) 320 MG tablet Take 320 mg by mouth daily.    Marland Kitchen zolpidem (AMBIEN) 10 MG tablet Take 10 mg by mouth at bedtime as needed for sleep.  No current facility-administered medications on file prior to visit.     Allergies:  No Known Allergies   Physical Exam  Vitals:   11/26/18 1459  BP: 125/89  Pulse: 90  Temp: (!) 97.5 F (36.4 C)  TempSrc: Oral  Weight: 230 lb 12.8 oz (104.7 kg)  Height: 5\' 11"  (1.803 m)   Body mass index is 32.19 kg/m. No exam data present  Depression screen [facility] 2/9 11/26/2018  Decreased Interest 0  Down, Depressed, Hopeless 0  PHQ - 2 Score 0     General: well developed, well nourished,  pleasant elderly Caucasian male, seated, in no evident distress Head: head normocephalic and atraumatic.   Neck: supple with no carotid or supraclavicular bruits Cardiovascular: regular rate and rhythm, no murmurs Musculoskeletal: no deformity Skin:  no rash/petichiae Vascular:  Normal pulses all extremities   Neurologic Exam Mental Status: Awake and fully alert. Oriented to place and time. Recent and remote memory intact. Attention span, concentration and fund of knowledge appropriate. Mood and affect appropriate.  Cranial Nerves: Fundoscopic exam reveals sharp disc margins. Pupils equal, briskly reactive to light. Extraocular movements full without nystagmus. Visual fields full to confrontation. Hearing intact. Facial sensation intact. Face, tongue, palate moves normally and symmetrically.  Motor: Normal bulk and tone. Normal strength in all tested extremity muscles except mild right hand grip strength weakness with decreased dexterity Sensory.: intact to touch ,  pinprick , position and vibratory sensation.  Coordination: Rapid alternating movements normal in all extremities except mildly diminished right hand. Finger-to-nose and heel-to-shin performed accurately bilaterally.  Orbits left arm over right arm. Gait and Station: Arises from chair with mild difficulty. Stance is normal. Gait demonstrates  abnormality with favoring of right leg and imbalance with use of cane Reflexes: 1+ and symmetric. Toes downgoing.     NIHSS  0 Modified Rankin  2    Diagnostic Data (Labs, Imaging, Testing) Mr Angio Head Wo Contrast 10/05/2018 IMPRESSION:  Negative intracranial MRA   Mr Angio Neck W Wo Contrast 10/04/2018 IMPRESSION:  1. No acute abnormality.  2. Chronic parenchymal volume loss with mild changes of small vessel disease.  3. 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  4. Normal MRA of the neck.   Mr Brain Wo Contrast 10/04/2018 IMPRESSION:  1. No acute abnormality.  2. Chronic parenchymal volume loss with mild changes of small vessel disease.  3. 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  4. Normal MRA of the neck.   Ct Head Code Stroke Wo Contrast 10/04/2018 IMPRESSION:  1. No acute finding by CT. Age related atrophy and chronic small-vessel change of the hemispheric white matter. Benign appearing 4 mm parenchymal calcification at the inferior right frontal lobe.  2. ASPECTS is 10.    Transthoracic Echocardiogram  10/05/2018 IMPRESSIONS   1. The left ventricle has low normal systolic function, with an ejection fraction of 50-55%. The cavity size was normal. There is moderate concentric left ventricular hypertrophy. Left ventricular diastolic Doppler parameters are consistent with  impaired relaxation. Left ventricular diffuse hypokinesis.  2. The right ventricle has normal systolic function. The cavity was normal. There is no increase in right ventricular wall thickness.  3. Severely thickening of the aortic valve. Severe  calcifcation of the aortic valve. Aortic valve regurgitation was not assessed by color flow Doppler. Mild stenosis of the aortic valve.  4. The aorta is abnormal in size and structure.  5. The ascending aorta is normal in size and structure.  6. 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   EKG SR rate 61 BPM. LBBB (See cardiology reading for complete details)      ASSESSMENT: Graig Charleston is a 78 y.o. year old male presented with transient right-sided weakness on 10/04/2018 with probable left hemispheric TIA secondary to small vessel disease. Vascular risk factors include HTN, HLD, DM, prior stroke, obesity, former tobacco use and CAD.  He was initially doing well after hospital discharge but approximately 2 weeks later, started to experience worsening of right-sided weakness, gait difficulty and imbalance.    PLAN:  1. TIA: Continue clopidogrel 75 mg daily  and atorvastatin for secondary stroke prevention. Maintain strict control of hypertension with blood pressure goal below 130/90, diabetes with hemoglobin A1c goal below 6.5% and cholesterol with LDL cholesterol (bad cholesterol) goal below 70 mg/dL.  I also advised the patient to eat a healthy diet with plenty of whole grains, cereals, fruits and vegetables, exercise regularly with at least 30 minutes of continuous activity daily and maintain ideal body weight. 2. Worsening symptoms: Discussion regarding potential small stroke prevention that was unable to be seen on MRI with potential worsening of symptoms but due to elevated blood pressure over 1 week period, order placed for CT head wo contrast to rule out potential new stroke.  Agree with initiating physical therapy tomorrow for ongoing improvement 3. HTN: Advised to continue current treatment regimen.  Today's BP stable.  Advised to continue to monitor at home along with continued follow-up with PCP for management 4. HLD: Advised to continue current treatment regimen  along with continued follow-up with PCP for future prescribing and monitoring of lipid panel 5. DMII: Advised to continue to monitor glucose levels at home along with continued follow-up with PCP for management and monitoring    Follow up in 3 months or call earlier if needed   Greater than 50% of time during this 45 minute visit was spent on counseling, explanation of diagnosis of TIA, discussion regarding worsening of prior symptoms, reviewing risk factor management of HTN, HLD and DM, planning of further management along with potential future management, and discussion with patient and answering all questions to patient satisfaction    Frann Rider, AGNP-BC  [facility] [address] [place], [place] [address]  Phone [phone] Fax [phone] Note: This document was prepared with digital dictation and possible smart phrase technology. Any transcriptional errors that result from this process are unintentional.

## 2018-11-26 NOTE — Patient Instructions (Addendum)
Continue clopidogrel 75 mg daily  and Lipitor for secondary stroke prevention  Continue to follow up with PCP regarding cholesterol, blood pressure and diabetes management   We will check CT scan to ensure no new stroke or abnormality that could be causing your new symptoms  Start therapy tomorrow as this will likely improve your ambulation and balance difficulties as well as overall endurance  Continue to monitor blood pressure at home  Maintain strict control of hypertension with blood pressure goal below 130/90, diabetes with hemoglobin A1c goal below 6.5% and cholesterol with LDL cholesterol (bad cholesterol) goal below 70 mg/dL. I also advised the patient to eat a healthy diet with plenty of whole grains, cereals, fruits and vegetables, exercise regularly and maintain ideal body weight.  Followup in the future with me in 3 months or call earlier if needed       Thank you for coming to see [place] at [facility]. I hope we have been able to provide you high quality care today.  You may receive a patient satisfaction survey over the next few weeks. We would appreciate your feedback and comments so that we may continue to improve ourselves and the health of our patients.

## 2018-11-27 ENCOUNTER — Telehealth: Payer: Self-pay | Admitting: Adult Health

## 2018-11-27 NOTE — Telephone Encounter (Signed)
Mcarthur Rossetti Josem Kaufmann: [id number] (exp. 11/27/18 to 12/27/18) order sent to GI. They will reach out to the patient to schedule.

## 2018-11-28 NOTE — Progress Notes (Signed)
I agree with the above plan 

## 2018-12-04 ENCOUNTER — Ambulatory Visit
Admission: RE | Admit: 2018-12-04 | Discharge: 2018-12-04 | Disposition: A | Discharge status: Home / Self Care | Payer: Medicare HMO | Source: Ambulatory Visit | Attending: Adult Health | Admitting: Adult Health

## 2018-12-04 ENCOUNTER — Other Ambulatory Visit: Payer: Self-pay

## 2018-12-04 DIAGNOSIS — R29818 Other symptoms and signs involving the nervous system: Secondary | ICD-10-CM | POA: Diagnosis not present

## 2018-12-11 ENCOUNTER — Telehealth: Payer: Self-pay

## 2018-12-11 NOTE — Telephone Encounter (Signed)
Spoke with the patient and he verbalized understanding his results. No questions or concerns at this time.   

## 2018-12-11 NOTE — Telephone Encounter (Signed)
-----   Message from Brandon Melnick, RN sent at 12/10/2018  4:16 PM EDT -----  ----- Message ----- From: Frann Rider, NP Sent: 12/10/2018   7:31 AM EDT To: Brandon Melnick, RN  Please notify patient that his recent imaging did not show any abnormality

## 2019-02-25 ENCOUNTER — Ambulatory Visit: Payer: Medicare HMO | Admitting: Adult Health

## 2019-11-26 IMAGING — MR MRA HEAD WITHOUT CONTRAST
1 series · 20 of 48 positions shown · non-contrast
Comparison: Noncontrast brain MRI from yesterday

CLINICAL DATA: TIA.  Right-sided numbness and weakness

EXAM:
MRA HEAD WITHOUT CONTRAST
TECHNIQUE: Angiographic images of the Circle of Willis were obtained using MRA
technique without intravenous contrast.

[Series 5: 3d cow · axial · 0.5mm · 0.41mm/px · z∈[-5,+84]mm · 20 of 188 slices shown]
[im 1/188]
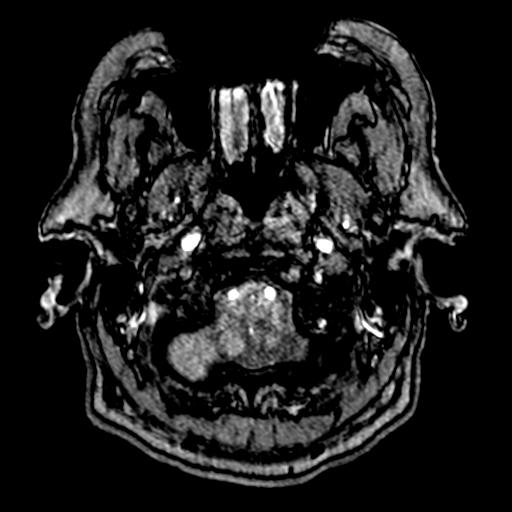
[im 4/188]
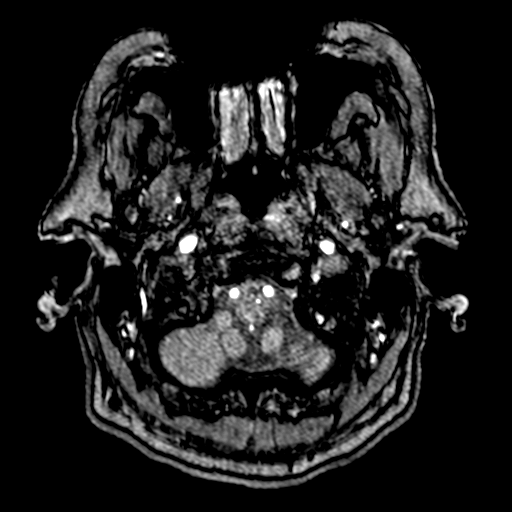
[im 8/188]
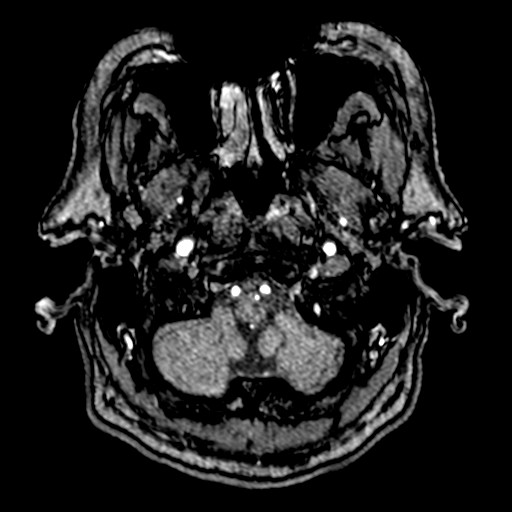
[im 12/188]
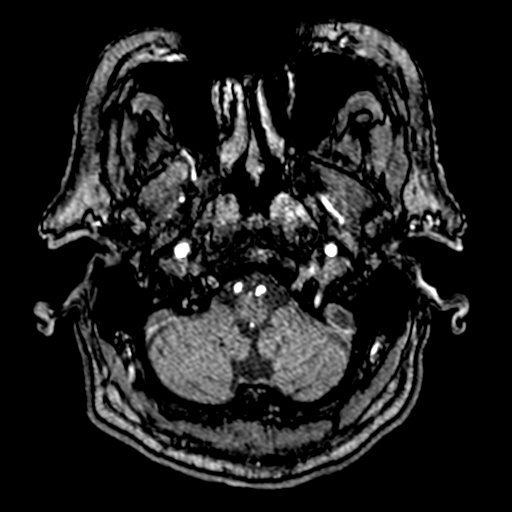
[im 16/188]
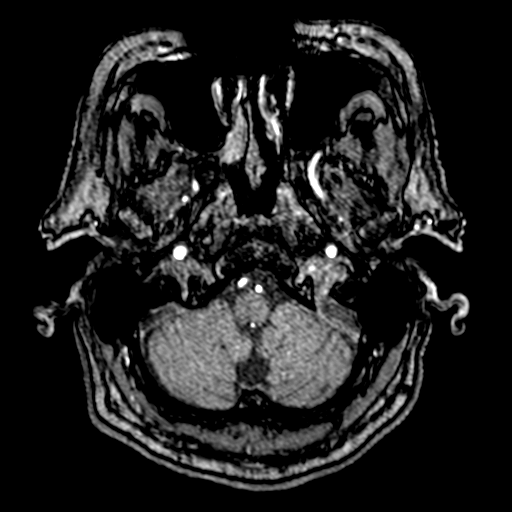
[im 20/188]
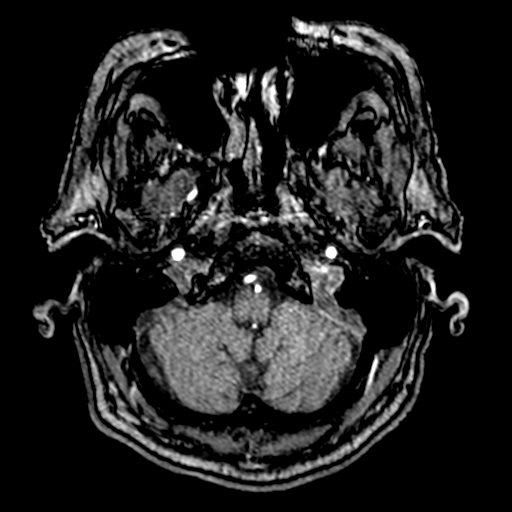
[im 24/188]
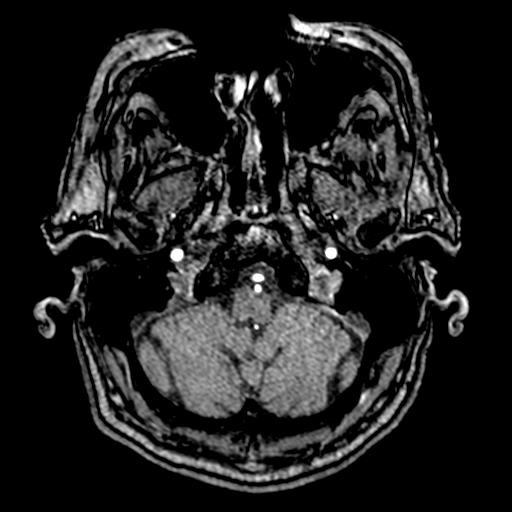
[im 28/188]
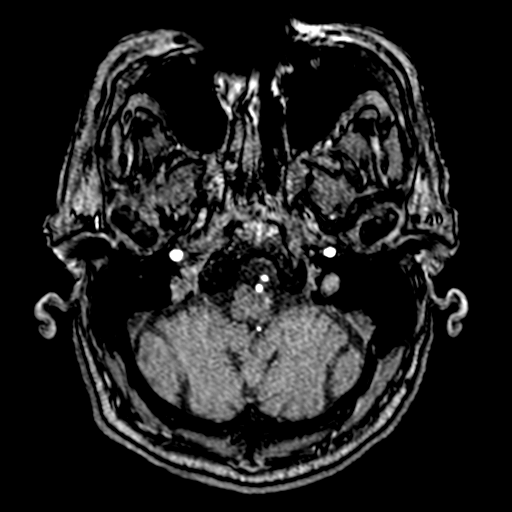
[im 32/188]
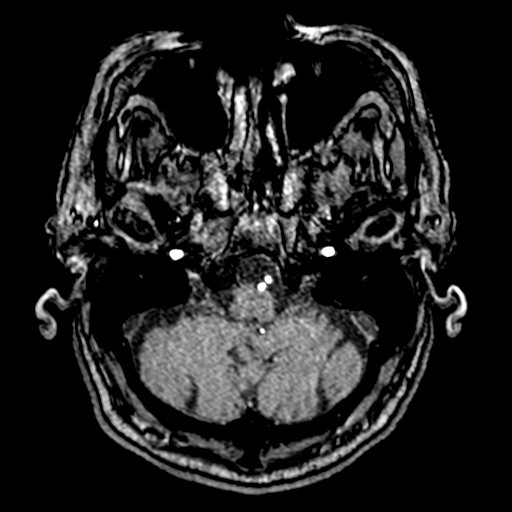
[im 36/188]
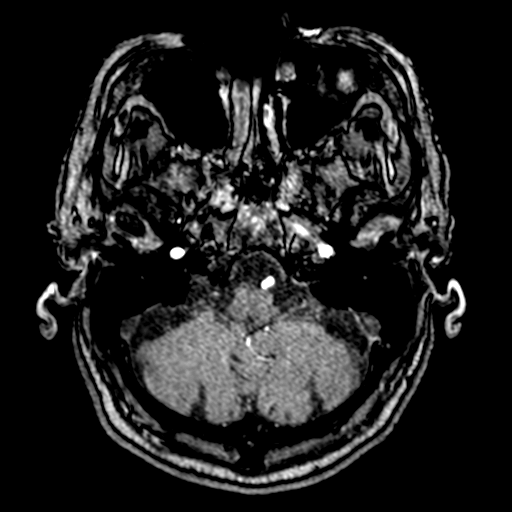
[im 40/188]
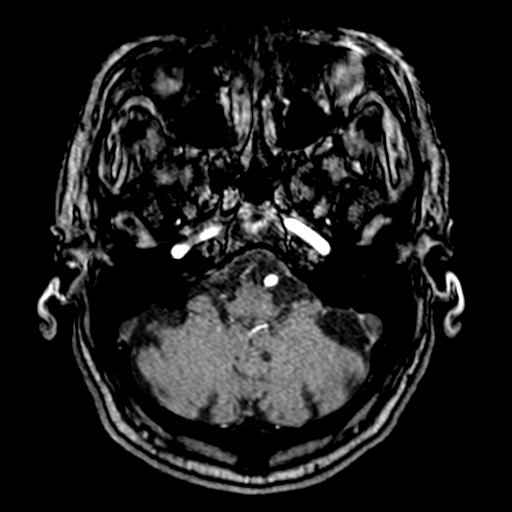
[im 44/188]
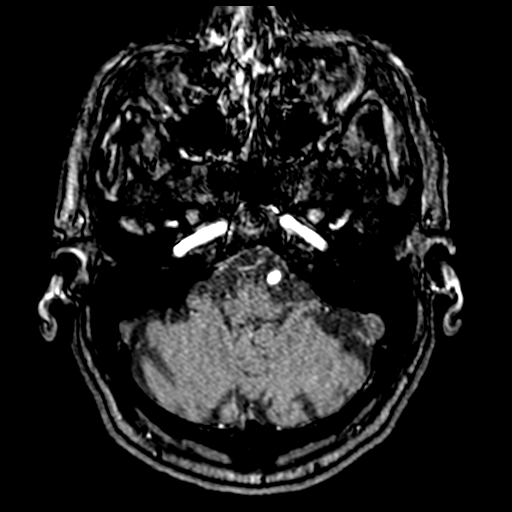
[im 60/188]
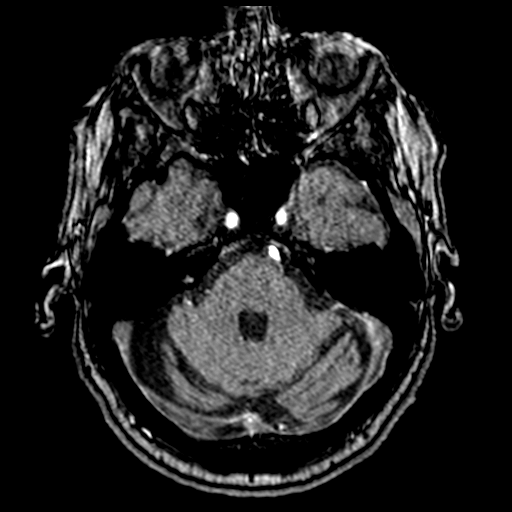
[im 84/188]
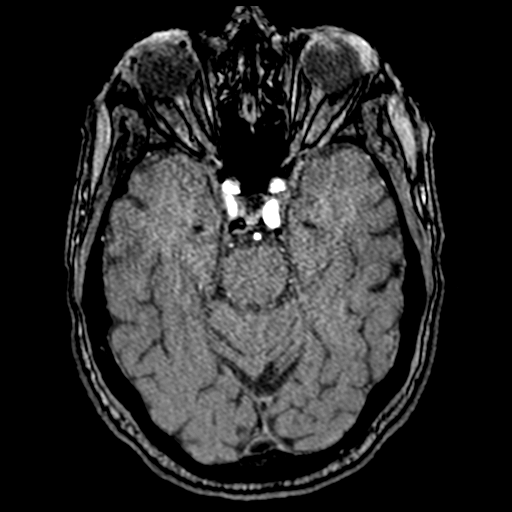
[im 96/188]
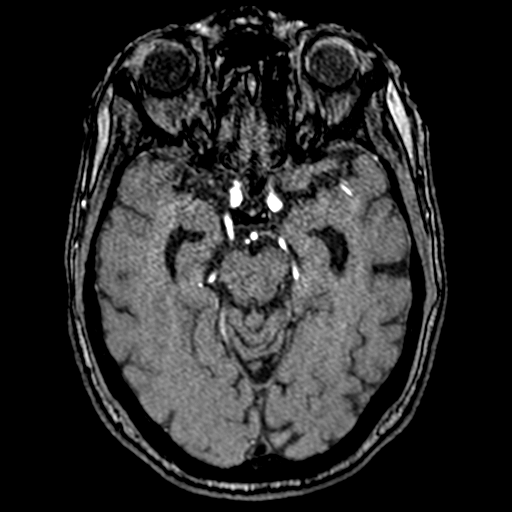
[im 108/188]
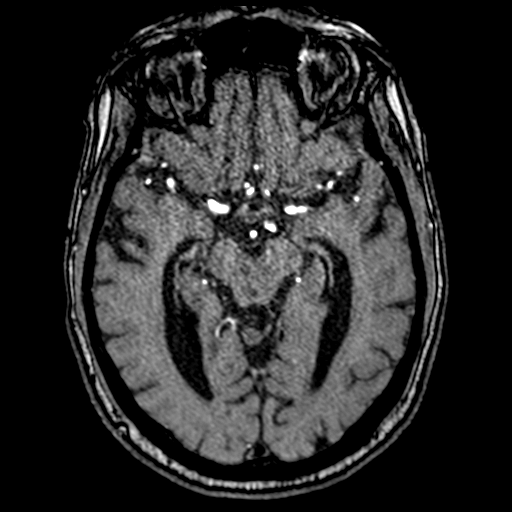
[im 132/188]
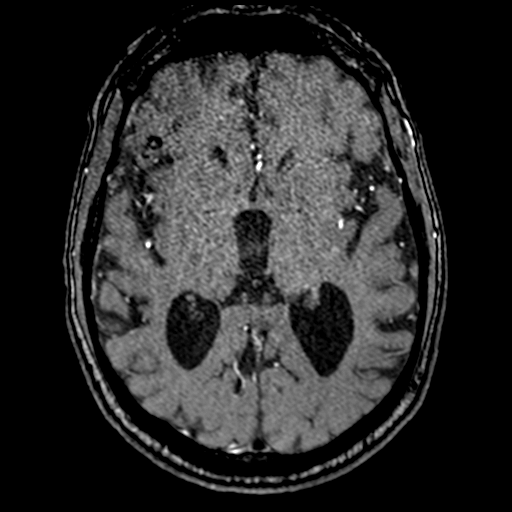
[im 156/188]
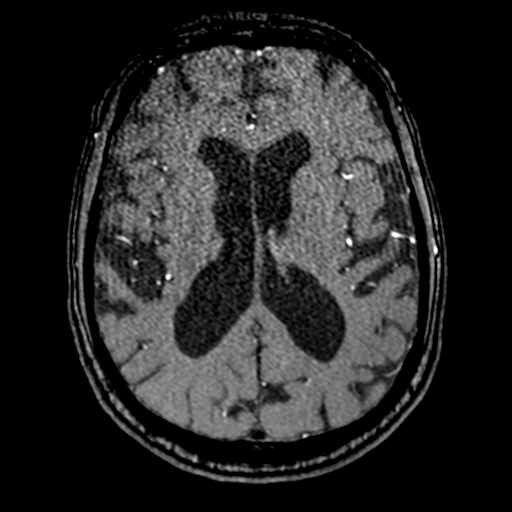
[im 160/188]
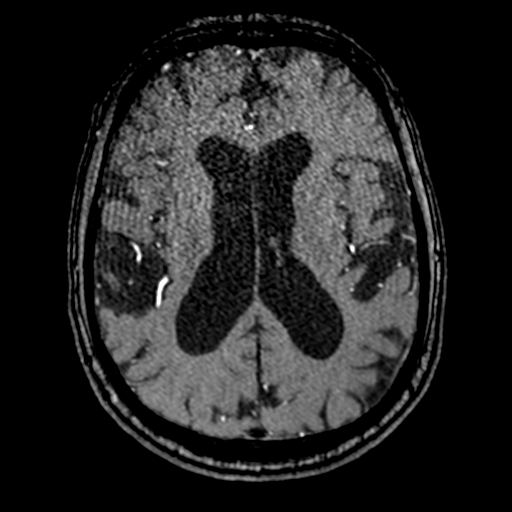
[im 180/188]
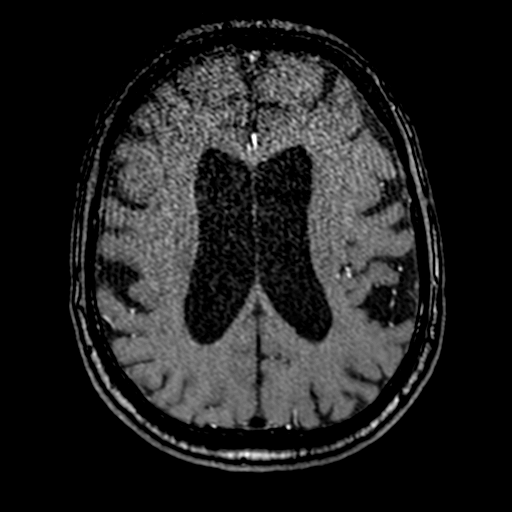

[20 of 48 positions shown; findings below may reference images not displayed]

FINDINGS: Symmetric carotid and vertebral arteries. Fetal type right PCA. No
branch occlusion, flow limiting stenosis, or beading. Outpouchings
rightward from the basilar and from the left supraclinoid ICA are
attributed to infundibula based on source images.
IMPRESSION: Negative intracranial MRA
